# Patient Record
Sex: Female | Born: 1995 | Race: White | Hispanic: No | Marital: Single | Smoking: Never smoker
Health system: Southern US, Community
[De-identification: ages and names within clinical notes are randomized; demographics above are authoritative.]

## PROBLEM LIST (undated history)

## (undated) DIAGNOSIS — F329 Major depressive disorder, single episode, unspecified: Secondary | ICD-10-CM

## (undated) DIAGNOSIS — Z8619 Personal history of other infectious and parasitic diseases: Secondary | ICD-10-CM

## (undated) DIAGNOSIS — F419 Anxiety disorder, unspecified: Secondary | ICD-10-CM

## (undated) DIAGNOSIS — F32A Depression, unspecified: Secondary | ICD-10-CM

## (undated) HISTORY — DX: Anxiety disorder, unspecified: F41.9

## (undated) HISTORY — DX: Depression, unspecified: F32.A

## (undated) HISTORY — DX: Personal history of other infectious and parasitic diseases: Z86.19

---
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
## 2017-02-18 ENCOUNTER — Ambulatory Visit (INDEPENDENT_AMBULATORY_CARE_PROVIDER_SITE_OTHER): Payer: BC Managed Care – PPO | Admitting: Family Medicine

## 2017-02-18 ENCOUNTER — Encounter: Payer: Self-pay | Admitting: Family Medicine

## 2017-02-18 VITALS — BP 122/82 | HR 87 | Temp 98.4°F | Ht 65.0 in | Wt 167.0 lb

## 2017-02-18 DIAGNOSIS — N943 Premenstrual tension syndrome: Secondary | ICD-10-CM | POA: Diagnosis not present

## 2017-02-18 DIAGNOSIS — J039 Acute tonsillitis, unspecified: Secondary | ICD-10-CM | POA: Diagnosis not present

## 2017-02-18 DIAGNOSIS — J3501 Chronic tonsillitis: Secondary | ICD-10-CM | POA: Diagnosis not present

## 2017-02-18 DIAGNOSIS — E663 Overweight: Secondary | ICD-10-CM

## 2017-02-18 LAB — COMPREHENSIVE METABOLIC PANEL
ALT: 13 U/L (ref 0–35)
AST: 18 U/L (ref 0–37)
Albumin: 4.1 g/dL (ref 3.5–5.2)
Alkaline Phosphatase: 44 U/L (ref 39–117)
BUN: 10 mg/dL (ref 6–23)
CO2: 28 mEq/L (ref 19–32)
Calcium: 9.7 mg/dL (ref 8.4–10.5)
Chloride: 104 mEq/L (ref 96–112)
Creatinine, Ser: 0.98 mg/dL (ref 0.40–1.20)
GFR: 76.04 mL/min (ref >60.00)
Glucose, Bld: 108 mg/dL — ABNORMAL HIGH (ref 70–99)
Potassium: 4.2 mEq/L (ref 3.5–5.1)
Sodium: 138 mEq/L (ref 135–145)
Total Bilirubin: 0.5 mg/dL (ref 0.2–1.2)
Total Protein: 7.6 g/dL (ref 6.0–8.3)

## 2017-02-18 LAB — CBC WITH DIFFERENTIAL/PLATELET
Basophils Absolute: 0 10*3/uL (ref 0.0–0.1)
Basophils Relative: 0.6 % (ref 0.0–3.0)
Eosinophils Absolute: 0.1 10*3/uL (ref 0.0–0.7)
Eosinophils Relative: 1.1 % (ref 0.0–5.0)
HCT: 37.5 % (ref 36.0–46.0)
Hemoglobin: 12.7 g/dL (ref 12.0–15.0)
Lymphocytes Relative: 17.1 % (ref 12.0–46.0)
Lymphs Abs: 1.2 10*3/uL (ref 0.7–4.0)
MCHC: 33.7 g/dL (ref 30.0–36.0)
MCV: 91.4 fl (ref 78.0–100.0)
Monocytes Absolute: 0.5 10*3/uL (ref 0.1–1.0)
Monocytes Relative: 7.7 % (ref 3.0–12.0)
Neutro Abs: 5.2 10*3/uL (ref 1.4–7.7)
Neutrophils Relative %: 73.5 % (ref 43.0–77.0)
Platelets: 289 10*3/uL (ref 150.0–400.0)
RBC: 4.11 Mil/uL (ref 3.87–5.11)
RDW: 12.6 % (ref 11.5–15.5)
WBC: 7.1 10*3/uL (ref 4.0–10.5)

## 2017-02-18 LAB — POCT MONO (EPSTEIN BARR VIRUS): Mono, POC: NEGATIVE

## 2017-02-18 NOTE — Progress Notes (Signed)
Jacqueline Giles is a 21 y.o. female is here to [facility].   Patient Care Team: Helane Rima, DO as PCP - General (Family Medicine)   History of Present Illness:  Britt Bottom CMA acting as scribe for Dr. Earlene Plater.  HPI:   1. Tonsillitis, chronic. Bilateral. Worse over the past year. She has been seen multiple times at [facility] and reports that she was told at the last visit that her Mono was +. Endorses snoring. No weight loss, night sweats, fatigue. No other focal, systemic, or lymphatic concerns.   2. Premenstrual syndrome. Endorses "bad PMS" each month. Interested in LARC, specifically Mirena. Sexually active. No Hx of STIs. No Hx of pregnancy.     Health Maintenance Due  Topic Date Due  . HIV Screening  12/29/2010  . TETANUS/TDAP  12/29/2014  . INFLUENZA VACCINE  12/05/2016  . PAP SMEAR  12/28/2016   Depression screen PHQ 2/9 02/21/2017  Decreased Interest 0  Down, Depressed, Hopeless 0  PHQ - 2 Score 0    PMHx, SurgHx, SocialHx, Medications, and Allergies were reviewed in the Visit Navigator and updated as appropriate.   No past medical history on file. No past surgical history on file. No family history on file.   Social History  Substance Use Topics  . Smoking status: Never Smoker  . Smokeless tobacco: Never Used  . Alcohol use Yes     Comment: Social    Current Medications and Allergies:   Current Outpatient Prescriptions:  .  Prenatal Vit-Fe Fumarate-FA (PRENATAL MULTIVITAMIN) TABS tablet, Take 1 tablet by mouth daily at 12 noon., Disp: , Rfl:   No Known Allergies   Review of Systems:   Pertinent items are noted in the HPI. Otherwise, ROS is negative.  Vitals:   Vitals:   02/18/17 1458  BP: 122/82  Pulse: 87  Temp: 98.4 F (36.9 C)  TempSrc: Oral  SpO2: 99%  Weight: 167 lb (75.8 kg)  Height:  (1.651 m)     Body mass index is 27.79 kg/m.   Physical Exam:   Physical Exam  Constitutional: She is oriented to person, place, and time.  She appears well-developed and well-nourished. No distress.  HENT:  Head: Normocephalic and atraumatic.  Right Ear: External ear normal.  Left Ear: External ear normal.  Nose: Nose normal.  Mouth/Throat: Oropharynx is clear and moist. Tonsils are 3+ on the right. No tonsillar exudate.  Eyes: Pupils are equal, round, and reactive to light. Conjunctivae and EOM are normal.  Neck: Normal range of motion. Neck supple. No thyromegaly present.  Cardiovascular: Normal rate, regular rhythm, normal heart sounds and intact distal pulses.   Pulmonary/Chest: Effort normal and breath sounds normal.  Abdominal: Soft. Bowel sounds are normal.  Musculoskeletal: Normal range of motion.  Lymphadenopathy:    She has no cervical adenopathy.  Neurological: She is alert and oriented to person, place, and time.  Skin: Skin is warm and dry. Capillary refill takes less than 2 seconds.  Psychiatric: She has a normal mood and affect. Her behavior is normal.  Nursing note and vitals reviewed.   Results for orders placed or performed in visit on 02/18/17  CBC with Differential/Platelet  Result Value Ref Range   WBC 7.1 4.0 - 10.5 K/uL   RBC 4.11 3.87 - 5.11 Mil/uL   Hemoglobin 12.7 12.0 - 15.0 g/dL   HCT 95.6 38.7 - 56.4 %   MCV 91.4 78.0 - 100.0 fl   MCHC 33.7 30.0 - 36.0 g/dL  RDW 12.6 11.5 - 15.5 %   Platelets 289.0 150.0 - 400.0 K/uL   Neutrophils Relative % 73.5 43.0 - 77.0 %   Lymphocytes Relative 17.1 12.0 - 46.0 %   Monocytes Relative 7.7 3.0 - 12.0 %   Eosinophils Relative 1.1 0.0 - 5.0 %   Basophils Relative 0.6 0.0 - 3.0 %   Neutro Abs 5.2 1.4 - 7.7 K/uL   Lymphs Abs 1.2 0.7 - 4.0 K/uL   Monocytes Absolute 0.5 0.1 - 1.0 K/uL   Eosinophils Absolute 0.1 0.0 - 0.7 K/uL   Basophils Absolute 0.0 0.0 - 0.1 K/uL  Comprehensive metabolic panel  Result Value Ref Range   Sodium 138 135 - 145 mEq/L   Potassium 4.2 3.5 - 5.1 mEq/L   Chloride 104 96 - 112 mEq/L   CO2 28 19 - 32 mEq/L   Glucose, Bld  108 (H) 70 - 99 mg/dL   BUN 10 6 - 23 mg/dL   Creatinine, Ser 0.98 0.40 - 1.20 mg/dL   Total Bilirubin 0.5 0.2 - 1.2 mg/dL   Alkaline Phosphatase 44 39 - 117 U/L   AST 18 0 - 37 U/L   ALT 13 0 - 35 U/L   Total Protein 7.6 6.0 - 8.3 g/dL   Albumin 4.1 3.5 - 5.2 g/dL   Calcium 9.7 8.4 - 11.9 mg/dL   GFR 14.78 >29.56 mL/min  POCT Mono (Epstein Barr Virus)  Result Value Ref Range   Mono, POC Negative Negative    Assessment and Plan:   Bree was seen today for establish care.  Diagnoses and all orders for this visit:  Tonsillitis, chronic Comments: Recent reported Mono +. Negative today. Labs reassuring. Reports of snoring. To ENT. Orders: -     CBC with Differential/Platelet -     Comprehensive metabolic panel -     Cancel: Monospot -     Ambulatory referral to ENT -     POCT Mono (Epstein Barr Virus)  Overweight (BMI 25.0-29.9) Comments: The patient is asked to make an attempt to improve diet and exercise patterns to aid in medical management of this problem.   Premenstrual syndrome Comments: Versus PMDD. Patient is requesting Mirena. Will refer to GYN. Orders: -     Ambulatory referral to Gynecology   . Reviewed expectations re: course of current medical issues. . Discussed self-management of symptoms. . Outlined signs and symptoms indicating need for more acute intervention. . Patient verbalized understanding and all questions were answered. Marland Kitchen Health Maintenance issues including appropriate healthy diet, exercise, and smoking avoidance were discussed with patient. . See orders for this visit as documented in the electronic medical record. . Patient received an After Visit Summary.  CMA served as Neurosurgeon during this visit. History, Physical, and Plan performed by medical provider. The above documentation has been reviewed and is accurate and complete. Helane Rima, D.O.  Helane Rima, DO [place] [place] [facility] 02/21/2017

## 2017-02-20 ENCOUNTER — Telehealth: Payer: Self-pay | Admitting: *Deleted

## 2017-02-20 NOTE — Telephone Encounter (Signed)
Patient called requesting her lab results. Please advise.

## 2017-02-21 ENCOUNTER — Encounter: Payer: Self-pay | Admitting: Family Medicine

## 2017-02-21 NOTE — Telephone Encounter (Signed)
Please advise 

## 2017-02-21 NOTE — Telephone Encounter (Signed)
Labs very reassuring. Mono was actually negative. Okay to send in Prednisone 5 mg (#21). However, patient does not actually need to take.ENT and GYN referrals are in. Apologize to her for me - you can let her know that I had to be out a day.

## 2017-02-22 ENCOUNTER — Telehealth: Payer: Self-pay | Admitting: Obstetrics and Gynecology

## 2017-02-22 NOTE — Telephone Encounter (Signed)
Tried to call patient.  No answer.  Voicemail not set up. 

## 2017-02-22 NOTE — Telephone Encounter (Signed)
Called patient to schedule a new patient appointment with Dr. Oscar LaJertson but her voice mail is not set up, unable to leave a message to call back.

## 2017-03-01 NOTE — Telephone Encounter (Signed)
Spoke with patient and gave results.  She declined prednisone taper.  She has appointment scheduled with GYN.  Still waiting to hear from ENT.

## 2017-04-06 HISTORY — PX: TONSILLECTOMY: SUR1361

## 2017-04-14 ENCOUNTER — Telehealth: Payer: Self-pay | Admitting: Obstetrics and Gynecology

## 2017-04-14 NOTE — Telephone Encounter (Signed)
04/14/17 spoke with pt re: dr cx appt for AEX/consult Mirena due to inclement weather/[place]

## 2017-04-15 ENCOUNTER — Ambulatory Visit: Payer: BC Managed Care – PPO | Admitting: Obstetrics and Gynecology

## 2017-04-23 ENCOUNTER — Emergency Department
Admission: EM | Admit: 2017-04-23 | Discharge: 2017-04-23 | Disposition: A | Discharge status: Home / Self Care | Payer: BC Managed Care – PPO | Attending: Emergency Medicine | Admitting: Emergency Medicine

## 2017-04-23 ENCOUNTER — Encounter: Payer: Self-pay | Admitting: Intensive Care

## 2017-04-23 DIAGNOSIS — R531 Weakness: Secondary | ICD-10-CM | POA: Insufficient documentation

## 2017-04-23 DIAGNOSIS — Z79899 Other long term (current) drug therapy: Secondary | ICD-10-CM | POA: Insufficient documentation

## 2017-04-23 LAB — BASIC METABOLIC PANEL
ANION GAP: 8 (ref 5–15)
BUN: 8 mg/dL (ref 6–20)
CALCIUM: 9.4 mg/dL (ref 8.9–10.3)
CO2: 28 mmol/L (ref 22–32)
Chloride: 100 mmol/L — ABNORMAL LOW (ref 101–111)
Creatinine, Ser: 0.76 mg/dL (ref 0.44–1.00)
Glucose, Bld: 103 mg/dL — ABNORMAL HIGH (ref 65–99)
Potassium: 3.8 mmol/L (ref 3.5–5.1)
SODIUM: 136 mmol/L (ref 135–145)

## 2017-04-23 LAB — CBC WITH DIFFERENTIAL/PLATELET
BASOS ABS: 0 10*3/uL (ref 0–0.1)
BASOS PCT: 0 %
EOS ABS: 0.1 10*3/uL (ref 0–0.7)
EOS PCT: 1 %
HEMATOCRIT: 37.5 % (ref 35.0–47.0)
Hemoglobin: 12.9 g/dL (ref 12.0–16.0)
Lymphocytes Relative: 10 %
Lymphs Abs: 1.1 10*3/uL (ref 1.0–3.6)
MCH: 30.5 pg (ref 26.0–34.0)
MCHC: 34.4 g/dL (ref 32.0–36.0)
MCV: 88.6 fL (ref 80.0–100.0)
MONO ABS: 0.9 10*3/uL (ref 0.2–0.9)
Monocytes Relative: 8 %
NEUTROS ABS: 8.9 10*3/uL — AB (ref 1.4–6.5)
Neutrophils Relative %: 81 %
PLATELETS: 306 10*3/uL (ref 150–440)
RBC: 4.23 MIL/uL (ref 3.80–5.20)
RDW: 12 % (ref 11.5–14.5)
WBC: 11.1 10*3/uL — ABNORMAL HIGH (ref 3.6–11.0)

## 2017-04-23 NOTE — ED Provider Notes (Signed)
[facility] Emergency Department Provider Note  ____________________________________________   First MD Initiated Contact with Patient 04/23/17 1859     (approximate)  I have reviewed the triage vital signs and the nursing notes.   HISTORY  Chief Complaint No chief complaint on file.   HPI Jacqueline JewsOlivia Giles is a 21 y.o. female with a recent tonsillectomy 5 days ago who is presenting to the emergency department with weakness.  She is accompanied by her mother who is concerned that the patient may be losing blood from her tonsillectomy.  The mother reports that the patient's sister had a bleeding post operative complication during a tonsillectomy and never coughed up any blood.  Patient does not report any dark stools or blood in her stools.  Says that she had a temperature of 100.2 last night but no temperature today.  Has been taking oxycodone.  Does not report taking Tylenol or ibuprofen.   History reviewed. No pertinent past medical history.  There are no active problems to display for this patient.   History reviewed. No pertinent surgical history.  Prior to Admission medications   Medication Sig Start Date End Date Taking? Authorizing Provider  Prenatal Vit-Fe Fumarate-FA (PRENATAL MULTIVITAMIN) TABS tablet Take 1 tablet by mouth daily at 12 noon.    [provider]    Allergies Patient has no known allergies.  History reviewed. No pertinent family history.  Social History Social History   Tobacco Use  . Smoking status: Never Smoker  . Smokeless tobacco: Never Used  Substance Use Topics  . Alcohol use: Yes    Comment: Social   . Drug use: No    Review of Systems  Constitutional: No fever/chills Eyes: No visual changes. ENT: No sore throat. Cardiovascular: Denies chest pain. Respiratory: Denies shortness of breath. Gastrointestinal: No abdominal pain.  No nausea, no vomiting.  No diarrhea.  No constipation. Genitourinary:  Negative for dysuria. Musculoskeletal: Negative for back pain. Skin: Negative for rash. Neurological: Negative for headaches, focal weakness or numbness.   ____________________________________________   PHYSICAL EXAM:  VITAL SIGNS: ED Triage Vitals [04/23/17 1830]  Enc Vitals Group     BP (!) 145/76     Pulse Rate 91     Resp 18     Temp 98.5 F (36.9 C)     Temp Source Oral     SpO2 100 %     Weight 160 lb (72.6 kg)     Height 5\' 6"  (1.676 m)     Head Circumference      Peak Flow      Pain Score 3     Pain Loc      Pain Edu?      Excl. in GC?     Constitutional: Alert and oriented. Well appearing and in no acute distress. Eyes: Conjunctivae are normal.  Head: Atraumatic. Nose: No congestion/rhinnorhea. Mouth/Throat: Mucous membranes are moist.  SR visualized the bilateral tonsils without any bleeding or scabbing.  Patient speaking with a normal voice.  Controlling secretions. Neck: No stridor.   Cardiovascular: Normal rate, regular rhythm. Grossly normal heart sounds.   Respiratory: Normal respiratory effort.  No retractions. Lungs CTAB. Gastrointestinal: Soft and nontender. No distention.  Musculoskeletal: No lower extremity tenderness nor edema.   Neurologic:  Normal speech and language. No gross focal neurologic deficits are appreciated. Skin:  Skin is warm, dry and intact. No rash noted. Psychiatric: Mood and affect are normal. Speech and behavior are normal.  ____________________________________________  LABS (all labs ordered are listed, but only abnormal results are displayed)  Labs Reviewed  CBC WITH DIFFERENTIAL/PLATELET - Abnormal; Notable for the following components:      Result Value   WBC 11.1 (*)    Neutro Abs 8.9 (*)    All other components within normal limits  BASIC METABOLIC PANEL - Abnormal; Notable for the following components:   Chloride 100 (*)    Glucose, Bld 103 (*)    All other components within normal limits    ____________________________________________  EKG  ____________________________________________  RADIOLOGY   ____________________________________________   PROCEDURES  Procedure(s) performed:   Procedures  Critical Care performed:   ____________________________________________   INITIAL IMPRESSION / ASSESSMENT AND PLAN / ED COURSE  Pertinent labs & imaging results that were available during my care of the patient were reviewed by me and considered in my medical decision making (see chart for details).  DDX: Anemia, dehydration, weakness, viral syndrome No previous records for review on file   ----------------------------------------- 8:01 PM on 04/23/2017 -----------------------------------------  Patient with normal hemoglobin.  Slightly elevated white blood cell count which may be reactive secondary to the surgery recently.  Reviewed the lab results with patient as well as her mother who is at the bedside.  The mother called the ENT earlier this afternoon and says that she will be calling in the morning for follow-up.  They will continue their current plan at home for plenty of fluids.  They are also aware that oxycodone can be sedating and may be contributing to the patient's weakness.  ____________________________________________   FINAL CLINICAL IMPRESSION(S) / ED DIAGNOSES  Weakness.  Postop check for tonsillectomy    NEW MEDICATIONS STARTED DURING THIS VISIT:  This SmartLink is deprecated. Use AVSMEDLIST instead to display the medication list for a patient.   Note:  This document was prepared using Dragon voice recognition software and may include unintentional dictation errors.     Myrna BlazerSchaevitz, David Matthew, MD 04/23/17 2002

## 2017-04-23 NOTE — ED Notes (Signed)
Pt stating that she had her tonsils removed on Friday this past week. Pt's mother stating that pt was doing really well until today. Pt had a fever last night. Pt stating that today she began feeling "really weak." Pt stating that she also has had nausea. Pt stating that she is still tasting some blood but it hasn't increased. Pt stating that she has had no energy today. Pt stating last BM was Thursday before surgery. Pt in NAD at this time. Airway is intact.

## 2017-04-23 NOTE — ED Notes (Signed)
Dr Pershing ProudSchaevitz is at pt's bedside.

## 2017-04-23 NOTE — ED Triage Notes (Signed)
Tonsillectomy X5 days ago. Pt c/o tasting blood in the back of her throat and feels weak. Pt states "I struggle to stand up and feel like I have been sleeping a lot" Pt is taking oxycodone for pain. Ambulatory back to triage with no problems and A&O x4.

## 2017-05-06 ENCOUNTER — Telehealth: Payer: Self-pay | Admitting: Obstetrics and Gynecology

## 2017-05-06 NOTE — Telephone Encounter (Signed)
Called patient to confirm new patient appointment for Wednesday.  Patient cancelled says she is out of town for the week.

## 2017-05-08 ENCOUNTER — Ambulatory Visit: Payer: BC Managed Care – PPO | Admitting: Obstetrics and Gynecology

## 2017-07-29 ENCOUNTER — Ambulatory Visit: Payer: BC Managed Care – PPO | Admitting: Obstetrics and Gynecology

## 2017-07-29 ENCOUNTER — Encounter: Payer: Self-pay | Admitting: Obstetrics and Gynecology

## 2017-07-29 ENCOUNTER — Other Ambulatory Visit (HOSPITAL_COMMUNITY)
Admission: RE | Admit: 2017-07-29 | Discharge: 2017-07-29 | Disposition: A | Discharge status: Home / Self Care | Payer: BC Managed Care – PPO | Source: Ambulatory Visit | Attending: Obstetrics and Gynecology | Admitting: Obstetrics and Gynecology

## 2017-07-29 ENCOUNTER — Other Ambulatory Visit: Payer: Self-pay

## 2017-07-29 VITALS — BP 130/80 | HR 84 | Resp 14 | Ht 65.25 in | Wt 163.0 lb

## 2017-07-29 DIAGNOSIS — N898 Other specified noninflammatory disorders of vagina: Secondary | ICD-10-CM

## 2017-07-29 DIAGNOSIS — Z113 Encounter for screening for infections with a predominantly sexual mode of transmission: Secondary | ICD-10-CM | POA: Diagnosis not present

## 2017-07-29 DIAGNOSIS — Z124 Encounter for screening for malignant neoplasm of cervix: Secondary | ICD-10-CM | POA: Diagnosis not present

## 2017-07-29 DIAGNOSIS — N943 Premenstrual tension syndrome: Secondary | ICD-10-CM | POA: Diagnosis not present

## 2017-07-29 DIAGNOSIS — Z01419 Encounter for gynecological examination (general) (routine) without abnormal findings: Secondary | ICD-10-CM | POA: Diagnosis not present

## 2017-07-29 DIAGNOSIS — Z3009 Encounter for other general counseling and advice on contraception: Secondary | ICD-10-CM

## 2017-07-29 NOTE — Patient Instructions (Addendum)
EXERCISE AND DIET:  We recommended that you start or continue a regular exercise program for good health. Regular exercise means any activity that makes your heart beat faster and makes you sweat.  We recommend exercising at least 30 minutes per day at least 3 days a week, preferably 4 or 5.  We also recommend a diet low in fat and sugar.  Inactivity, poor dietary choices and obesity can cause diabetes, heart attack, stroke, and kidney damage, among others.    ALCOHOL AND SMOKING:  Women should limit their alcohol intake to no more than 7 drinks/beers/glasses of wine (combined, not each!) per week. Moderation of alcohol intake to this level decreases your risk of breast cancer and liver damage. And of course, no recreational drugs are part of a healthy lifestyle.  And absolutely no smoking or even second hand smoke. Most people know smoking can cause heart and lung diseases, but did you know it also contributes to weakening of your bones? Aging of your skin?  Yellowing of your teeth and nails?  CALCIUM AND VITAMIN D:  Adequate intake of calcium and Vitamin D are recommended.  The recommendations for exact amounts of these supplements seem to change often, but generally speaking 600 mg of calcium (either carbonate or citrate) and 800 units of Vitamin D per day seems prudent. Certain women may benefit from higher intake of Vitamin D.  If you are among these women, your doctor will have told you during your visit.    PAP SMEARS:  Pap smears, to check for cervical cancer or precancers,  have traditionally been done yearly, although recent scientific advances have shown that most women can have pap smears less often.  However, every woman still should have a physical exam from her gynecologist every year. It will include a breast check, inspection of the vulva and vagina to check for abnormal growths or skin changes, a visual exam of the cervix, and then an exam to evaluate the size and shape of the uterus and  ovaries.  And after 22 years of age, a rectal exam is indicated to check for rectal cancers. We will also provide age appropriate advice regarding health maintenance, like when you should have certain vaccines, screening for sexually transmitted diseases, bone density testing, colonoscopy, mammograms, etc.   MAMMOGRAMS:  All women over 40 years old should have a yearly mammogram. Many facilities now offer a "3D" mammogram, which may cost around $50 extra out of pocket. If possible,  we recommend you accept the option to have the 3D mammogram performed.  It both reduces the number of women who will be called back for extra views which then turn out to be normal, and it is better than the routine mammogram at detecting truly abnormal areas.    COLONOSCOPY:  Colonoscopy to screen for colon cancer is recommended for all women at age 50.  We know, you hate the idea of the prep.  We agree, BUT, having colon cancer and not knowing it is worse!!  Colon cancer so often starts as a polyp that can be seen and removed at colonscopy, which can quite literally save your life!  And if your first colonoscopy is normal and you have no family history of colon cancer, most women don't have to have it again for 10 years.  Once every ten years, you can do something that may end up saving your life, right?  We will be happy to help you get it scheduled when you are ready.    Be sure to check your insurance coverage so you understand how much it will cost.  It may be covered as a preventative service at no cost, but you should check your particular policy.       Breast Self-Awareness Breast self-awareness means being familiar with how your breasts look and feel. It involves checking your breasts regularly and reporting any changes to your health care provider. Practicing breast self-awareness is important. A change in your breasts can be a sign of a serious medical problem. Being familiar with how your breasts look and feel  allows you to find any problems early, when treatment is more likely to be successful. All women should practice breast self-awareness, including women who have had breast implants. How to do a breast self-exam One way to learn what is normal for your breasts and whether your breasts are changing is to do a breast self-exam. To do a breast self-exam: Look for Changes  1. Remove all the clothing above your waist. 2. Stand in front of a mirror in a room with good lighting. 3. Put your hands on your hips. 4. Push your hands firmly downward. 5. Compare your breasts in the mirror. Look for differences between them (asymmetry), such as: ? Differences in shape. ? Differences in size. ? Puckers, dips, and bumps in one breast and not the other. 6. Look at each breast for changes in your skin, such as: ? Redness. ? Scaly areas. 7. Look for changes in your nipples, such as: ? Discharge. ? Bleeding. ? Dimpling. ? Redness. ? A change in position. Feel for Changes  Carefully feel your breasts for lumps and changes. It is best to do this while lying on your back on the floor and again while sitting or standing in the shower or tub with soapy water on your skin. Feel each breast in the following way:  Place the arm on the side of the breast you are examining above your head.  Feel your breast with the other hand.  Start in the nipple area and make  inch (2 cm) overlapping circles to feel your breast. Use the pads of your three middle fingers to do this. Apply light pressure, then medium pressure, then firm pressure. The light pressure will allow you to feel the tissue closest to the skin. The medium pressure will allow you to feel the tissue that is a little deeper. The firm pressure will allow you to feel the tissue close to the ribs.  Continue the overlapping circles, moving downward over the breast until you feel your ribs below your breast.  Move one finger-width toward the center of the body.  Continue to use the  inch (2 cm) overlapping circles to feel your breast as you move slowly up toward your collarbone.  Continue the up and down exam using all three pressures until you reach your armpit.  Write Down What You Find  Write down what is normal for each breast and any changes that you find. Keep a written record with breast changes or normal findings for each breast. By writing this information down, you do not need to depend only on memory for size, tenderness, or location. Write down where you are in your menstrual cycle, if you are still menstruating. If you are having trouble noticing differences in your breasts, do not get discouraged. With time you will become more familiar with the variations in your breasts and more comfortable with the exam. How often should I examine my breasts?  Examine your breasts every month. If you are breastfeeding, the best time to examine your breasts is after a feeding or after using a breast pump. If you menstruate, the best time to examine your breasts is 5-7 days after your period is over. During your period, your breasts are lumpier, and it may be more difficult to notice changes. When should I see my health care provider? See your health care provider if you notice:  A change in shape or size of your breasts or nipples.  A change in the skin of your breast or nipples, such as a reddened or scaly area.  Unusual discharge from your nipples.  A lump or thick area that was not there before.  Pain in your breasts.  Anything that concerns you.  This information is not intended to replace advice given to you by your health care provider. Make sure you discuss any questions you have with your health care provider. Document Released: 04/23/2005 Document Revised: 09/29/2015 Document Reviewed: 03/13/2015 Elsevier Interactive Patient Education  2018 [employer]  Premenstrual Syndrome Premenstrual syndrome (PMS) is a group of physical,  emotional, and behavioral symptoms that affect women of childbearing age. PMS starts 1-2 weeks before the start of a woman's period and goes away a few days after the period starts. It often recurs in a predictable pattern. PMS can range from mild to severe. When it is severe, it is called premenstrual dysphoric disorder (PMDD). PMS can interfere in many ways with normal daily activities. What are the causes? The cause of this condition is not known, but it seems to be related to hormone changes that happen before menstruation. What are the signs or symptoms? Symptoms of this condition often happen every month. They go away completely after your period starts. Physical symptoms include:  Bloating.  Breast pain.  Headaches.  Extreme fatigue.  Backaches.  Swelling of the hands and feet.  Weight gain.  Hot flashes.  Emotional and behavioral symptoms include:  Mood swings.  Depression.  Angry outbursts.  Irritability.  Anxiety.  Crying spells.  Food cravings or appetite changes.  Changes in sexual desire.  Confusion.  Aggression.  Social withdrawal.  Poor concentration.  How is this diagnosed? This condition is diagnosed if symptoms of PMS:  Are present in the 5 days before your period starts.  End within 4 days after your period starts.  Happen at least 3 months in a row.  Interfere with some of your normal activities.  Other conditions that can cause some of these symptoms must be ruled out before PMS can be diagnosed. How is this treated? This condition may be treated by:  Maintaining a healthy lifestyle. This includes eating a balanced diet and exercising regularly.  Taking medicines. Medicines can help relieve symptoms such as cramps, aches, pains, headaches, and breast tenderness. Depending on the severity of the condition, your health care provider may recommend: ? Over-the-counter pain medicines. ? Prescription medicines for PMDD.  Follow  these instructions at home: Eating and drinking   Eat a well-balanced diet.  Avoid caffeine and alcohol.  Limit the amount of salt and salty foods you eat. This will help lessen bloating.  Drink enough fluid to keep your urine clear or pale yellow.  Take a multivitamin if told to by your health care provider. Lifestyle  Do not use any tobacco products, such as cigarettes, chewing tobacco, and e-cigarettes. If you need help quitting, ask your health care provider.  Exercise regularly as suggested by your  health care provider.  Get enough sleep.  Practice relaxation techniques.  Limit stress. Other Instructions  For 2-3 months, write down your symptoms, their severity, and how long they last. This will help your health care provider choose the best treatment for you.  Take over-the-counter and prescription medicines only as told by your health care provider.  If you are using oral contraceptive pills, use them as told by your health care provider. This information is not intended to replace advice given to you by your health care provider. Make sure you discuss any questions you have with your health care provider. Document Released: 04/20/2000 Document Revised: 05/25/2015 Document Reviewed: 01/21/2015 Elsevier Interactive Patient Education  [employer].

## 2017-07-29 NOTE — Progress Notes (Signed)
22 y.o. G0P0000 SingleCaucasianF here for annual exam and to discuss birth control options   Period Cycle (Days): 28 Period Duration (Days): 5 days Period Pattern: Regular Menstrual Flow: Moderate Menstrual Control: Tampon Menstrual Control Change Freq (Hours): changes tampon every 2 hours  Dysmenorrhea: (!) Moderate Dysmenorrhea Symptoms: Cramping  Cramps are tolerable with OTC medication. She c/o bad mood changes for a week prior to her cycle for the last couple of months. Goes from angry to sad. Not debilitating. No baseline depression.  Sexually active, same partner for a year, mostly using condoms. She had a tonsillectomy in 12/19. Has had 2 yeast infections since then, both self treated. Now with a 8 week h/o an increase in creamy, white vaginal d/c with an odor.   Patient's last menstrual period was 07/15/2017.          Sexually active: Yes.    The current method of family planning is none and condoms all the time.    Exercising: Yes.    cardio/ lifting Smoker:  no  Health Maintenance: Pap:  N/A TDaP:  12/2016 Gardasil: completed all 3 (she thinks, will check her records).   reports that she has never smoked. She has never used smokeless tobacco. She reports that she drinks alcohol. She reports that she does not use drugs.  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 Would like to go to [place]  Past Medical History:  Diagnosis Date  . Anxiety     Past Surgical History:  Procedure Laterality Date  . TONSILLECTOMY  04/2017    Current Outpatient Medications  Medication Sig Dispense Refill  . Prenatal Vit-Fe Fumarate-FA (PRENATAL MULTIVITAMIN) TABS tablet Take 1 tablet by mouth daily at 12 noon.     No current facility-administered medications for this visit.     Family History  Problem Relation Age of Onset  . Deep vein thrombosis Father [id number]    Her Dad is on blood thinners, he has a h/o a blood clot. Unaware of  a thrombophilia.  Review of Systems  Constitutional: Negative.   HENT: Negative.   Eyes: Negative.   Respiratory: Negative.   Cardiovascular: Negative.   Gastrointestinal: Negative.   Endocrine: Negative.   Genitourinary: Negative.   Musculoskeletal: Negative.   Skin: Negative.   Allergic/Immunologic: Negative.   Neurological: Negative.   Psychiatric/Behavioral: Negative.     Exam:   BP 130/80 (BP Location: Right Arm, Patient Position: Sitting, Cuff Size: Normal)   Pulse 84   Resp 14   Ht 5' 5.25" (1.657 m)   Wt 163 lb (73.9 kg)   LMP 07/15/2017   BMI 26.92 kg/m   Weight change: @WEIGHTCHANGE @ Height:   Height: 5' 5.25" (165.7 cm)  Ht Readings from Last 3 Encounters:  07/29/17 5' 5.25" (1.657 m)  04/23/17 5\' 6"  (1.676 m)  02/18/17 5\' 5"  (1.651 m)    General appearance: alert, cooperative and appears stated age Head: Normocephalic, without obvious abnormality, atraumatic Neck: no adenopathy, supple, symmetrical, trachea midline and thyroid normal to inspection and palpation Lungs: clear to auscultation bilaterally Cardiovascular: regular rate and rhythm Breasts: normal appearance, no masses or tenderness Abdomen: soft, non-tender; non distended,  no masses,  no organomegaly Extremities: extremities normal, atraumatic, no cyanosis or edema Skin: Skin color, texture, turgor normal. No rashes or lesions Lymph nodes: Cervical, supraclavicular, and axillary nodes normal. No abnormal inguinal nodes palpated Neurologic: Grossly normal   Pelvic: External genitalia:  no lesions              Urethra:  normal appearing urethra with no masses, tenderness or lesions              Bartholins and Skenes: normal                 Vagina: normal appearing vagina with an increase in creamy/watery, white vaginal d/c              Cervix: no cervical motion tenderness and no lesions               Bimanual Exam:  Uterus:  normal size, contour, position, consistency, mobility, non-tender  and anteverted              Adnexa: no mass, fullness, tenderness               Rectovaginal: Confirms               Anus:  normal sphincter tone, no lesions  Chaperone was present for exam.  A:  Well Woman with normal exam  Contraception  Family history of DVT  Vaginal d/c  PMS    P:   Pap with GC/CT  STD testing  Affirm sent  Discussed breast self awareness   Recommended she discuss her Father's h/o DVT and check if he was evaluated for a clotting disorder  Discussed option of OCP's or SSRI's for her PMS, she declines  Discussed the risks of OCP's (would want to know more about her Father's history if she wanted to go on OCP's in the future)  Discussed other options of contraception, she is interested in a Mirena IUD, will schedule during her cycle

## 2017-07-30 ENCOUNTER — Other Ambulatory Visit: Payer: Self-pay | Admitting: *Deleted

## 2017-07-30 LAB — HEP, RPR, HIV PANEL
HEP B S AG: NEGATIVE
HIV SCREEN 4TH GENERATION: NONREACTIVE
RPR: NONREACTIVE

## 2017-07-30 LAB — CYTOLOGY - PAP
Chlamydia: NEGATIVE
Diagnosis: NEGATIVE
Neisseria Gonorrhea: NEGATIVE

## 2017-07-30 LAB — HEPATITIS C ANTIBODY: Hep C Virus Ab: <0.1 s/co ratio (ref 0.0–0.9)

## 2017-07-30 LAB — VAGINITIS/VAGINOSIS, DNA PROBE
CANDIDA SPECIES: NEGATIVE
GARDNERELLA VAGINALIS: POSITIVE — AB
TRICHOMONAS VAG: NEGATIVE

## 2017-07-30 MED ORDER — METRONIDAZOLE 500 MG PO TABS: 500.0000 mg | ORAL_TABLET | Freq: Two times a day (BID) | ORAL | 0 refills | Status: DC

## 2017-08-20 ENCOUNTER — Telehealth: Payer: Self-pay | Admitting: Obstetrics and Gynecology

## 2017-08-20 NOTE — Telephone Encounter (Signed)
Patient is waiting for cycle to start for mirena insertion. Says her cycle is late, but she did take a pregnancy test and would like to know if she can bring negative test and have mirena insertion done.

## 2017-08-21 NOTE — Telephone Encounter (Signed)
Spoke with patient. LMP 07/15/17. SA, condoms for contraceptive. Negative UPT 4/16. Denies any other GYN symptoms or pain. Has not started menses, requesting to schedule IUD insertion. States menses have been regular in the past, may be due to increased stress.   Advised patient to return call to office with start of menses to schedule IUD insertion. Advised if menses does not start or any new symptoms develop, return call to office. Advised Dr. Oscar LaJertson will review, our office will return call with any additional recommendations. Patient verbalizes understanding.   Routing to provider for final review. Patient is agreeable to disposition. Will close encounter.

## 2017-09-02 ENCOUNTER — Telehealth: Payer: Self-pay | Admitting: Obstetrics and Gynecology

## 2017-09-02 NOTE — Telephone Encounter (Signed)
Patient called to let the nurse know she started her menstrual cycle on 08/30/17 and she is ready to schedule Mirena insertion.  Cc: Suzy/Rosa

## 2017-09-02 NOTE — Telephone Encounter (Signed)
Spoke with patient. LMP 08/30/17. Mirena IUD insertion scheduled for 09/03/17 at 3:30pm with Dr. Oscar La. Advised to take Motrin 800 mg with food and water one hour before procedure. Patient verbalizes understanding.  Order previously placed.  Routing to provider for final review. Patient is agreeable to disposition. Will close encounter.  Cc: Harland Dingwall

## 2017-09-03 ENCOUNTER — Encounter: Payer: Self-pay | Admitting: Obstetrics and Gynecology

## 2017-09-03 ENCOUNTER — Ambulatory Visit: Payer: BC Managed Care – PPO | Admitting: Obstetrics and Gynecology

## 2017-09-03 ENCOUNTER — Other Ambulatory Visit: Payer: Self-pay

## 2017-09-03 VITALS — BP 120/80 | HR 84 | Resp 14 | Wt 165.0 lb

## 2017-09-03 DIAGNOSIS — Z3009 Encounter for other general counseling and advice on contraception: Secondary | ICD-10-CM

## 2017-09-03 DIAGNOSIS — Z01812 Encounter for preprocedural laboratory examination: Secondary | ICD-10-CM | POA: Diagnosis not present

## 2017-09-03 DIAGNOSIS — Z30430 Encounter for insertion of intrauterine contraceptive device: Secondary | ICD-10-CM | POA: Diagnosis not present

## 2017-09-03 HISTORY — PX: INTRAUTERINE DEVICE (IUD) INSERTION: SHX5877

## 2017-09-03 LAB — POCT URINE PREGNANCY: PREG TEST UR: NEGATIVE

## 2017-09-03 NOTE — Progress Notes (Signed)
GYNECOLOGY  VISIT   HPI: 22 y.o.   Single  Caucasian  female   G0P0000 with Patient's last menstrual period was 08/30/2017.   here for Mirena IUD insertion    Negative cervical cultures on 07/29/17 She does have PMS the week prior to her cycle. Doesn't want to be on birth control pills, worried she will forget them.   GYNECOLOGIC HISTORY: Patient's last menstrual period was 08/30/2017. Contraception:none  Menopausal hormone therapy: none         OB History    Gravida  0   Para  0   Term  0   Preterm  0   AB  0   Living  0     SAB  0   TAB  0   Ectopic  0   Multiple  0   Live Births  0              There are no active problems to display for this patient.   Past Medical History:  Diagnosis Date  . Anxiety     Past Surgical History:  Procedure Laterality Date  . TONSILLECTOMY  04/2017    Current Outpatient Medications  Medication Sig Dispense Refill  . Prenatal Vit-Fe Fumarate-FA (PRENATAL MULTIVITAMIN) TABS tablet Take 1 tablet by mouth daily at 12 noon.     No current facility-administered medications for this visit.      ALLERGIES: Patient has no known allergies.  Family History  Problem Relation Age of Onset  . Deep vein thrombosis Father 60    Social History   Socioeconomic History  . Marital status: Single    Spouse name: Not on file  . Number of children: Not on file  . Years of education: Not on file  . Highest education level: Not on file  Occupational History  . Not on file  Social Needs  . Financial resource strain: Not on file  . Food insecurity:    Worry: Not on file    Inability: Not on file  . Transportation needs:    Medical: Not on file    Non-medical: Not on file  Tobacco Use  . Smoking status: Never Smoker  . Smokeless tobacco: Never Used  Substance and Sexual Activity  . Alcohol use: Yes    Comment: Social   . Drug use: No  . Sexual activity: Yes    Partners: Male    Birth control/protection: None   Lifestyle  . Physical activity:    Days per week: Not on file    Minutes per session: Not on file  . Stress: Not on file  Relationships  . Social connections:    Talks on phone: Not on file    Gets together: Not on file    Attends religious service: Not on file    Active member of club or organization: Not on file    Attends meetings of clubs or organizations: Not on file    Relationship status: Not on file  . Intimate partner violence:    Fear of current or ex partner: Not on file    Emotionally abused: Not on file    Physically abused: Not on file    Forced sexual activity: Not on file  Other Topics Concern  . Not on file  Social History Narrative  . Not on file    Review of Systems  Constitutional: Negative.   HENT: Negative.   Eyes: Negative.   Respiratory: Negative.   Cardiovascular: Negative.  Gastrointestinal: Negative.   Genitourinary: Negative.   Musculoskeletal: Negative.   Skin: Negative.   Neurological: Negative.   Endo/Heme/Allergies: Negative.   Psychiatric/Behavioral: Negative.     PHYSICAL EXAMINATION:    BP 120/80 (BP Location: Right Arm, Patient Position: Sitting, Cuff Size: Normal)   Pulse 84   Resp 14   Wt 165 lb (74.8 kg)   LMP 08/30/2017   BMI 27.25 kg/m     General appearance: alert, cooperative and appears stated age  Pelvic: External genitalia:  no lesions              Urethra:  normal appearing urethra with no masses, tenderness or lesions              Bartholins and Skenes: normal                 Vagina: normal appearing vagina with normal color and discharge, no lesions              Cervix: no lesions  The risks of the mirena IUD were reviewed with the patient, including infection, abnormal bleeding and uterine perfortion. Consent was signed.  A speculum was placed in the vagina, the cervix was cleansed with betadine. A tenaculum was placed on the cervix, the uterus sounded to 7 cm. The cervix was dilated to a 5 hagar  dilator  The mirena IUD was inserted without difficulty. The string were cut to 3-4 cm. The tenaculum was removed. Slight oozing from the tenaculum site was stopped with pressure.   The patient tolerated the procedure well.   Chaperone was present for exam.  ASSESSMENT Contraception PMS    PLAN Mirena IUD inserted, f/u in one month Discussed option of OCP's or SSRI's for PMS. Discussed the option of luteal phase or continued SSRI use (if she isn't having cycles it would be difficult to use luteal phase treatment) Will further discuss at her next visit   An After Visit Summary was printed and given to the patient.

## 2017-09-03 NOTE — Patient Instructions (Signed)
IUD Post-procedure Instructions . Cramping is common.  You may take Ibuprofen, Aleve, or Tylenol for the cramping.  This should resolve within 24 hours.   . You may have a small amount of spotting.  You should wear a mini pad for the next few days. . You may have intercourse in 24 hours. . You need to call the office if you have any pelvic pain, fever, heavy bleeding, or foul smelling vaginal discharge. . Shower or bathe as normal  Premenstrual Syndrome Premenstrual syndrome (PMS) is a group of physical, emotional, and behavioral symptoms that affect women of childbearing age. PMS starts 1-2 weeks before the start of a woman's period and goes away a few days after the period starts. It often recurs in a predictable pattern. PMS can range from mild to severe. When it is severe, it is called premenstrual dysphoric disorder (PMDD). PMS can interfere in many ways with normal daily activities. What are the causes? The cause of this condition is not known, but it seems to be related to hormone changes that happen before menstruation. What are the signs or symptoms? Symptoms of this condition often happen every month. They go away completely after your period starts. Physical symptoms include:  Bloating.  Breast pain.  Headaches.  Extreme fatigue.  Backaches.  Swelling of the hands and feet.  Weight gain.  Hot flashes.  Emotional and behavioral symptoms include:  Mood swings.  Depression.  Angry outbursts.  Irritability.  Anxiety.  Crying spells.  Food cravings or appetite changes.  Changes in sexual desire.  Confusion.  Aggression.  Social withdrawal.  Poor concentration.  How is this diagnosed? This condition is diagnosed if symptoms of PMS:  Are present in the 5 days before your period starts.  End within 4 days after your period starts.  Happen at least 3 months in a row.  Interfere with some of your normal activities.  Other conditions that can cause  some of these symptoms must be ruled out before PMS can be diagnosed. How is this treated? This condition may be treated by:  Maintaining a healthy lifestyle. This includes eating a balanced diet and exercising regularly.  Taking medicines. Medicines can help relieve symptoms such as cramps, aches, pains, headaches, and breast tenderness. Depending on the severity of the condition, your health care provider may recommend: ? Over-the-counter pain medicines. ? Prescription medicines for PMDD.  Follow these instructions at home: Eating and drinking   Eat a well-balanced diet.  Avoid caffeine and alcohol.  Limit the amount of salt and salty foods you eat. This will help lessen bloating.  Drink enough fluid to keep your urine clear or pale yellow.  Take a multivitamin if told to by your health care provider. Lifestyle  Do not use any tobacco products, such as cigarettes, chewing tobacco, and e-cigarettes. If you need help quitting, ask your health care provider.  Exercise regularly as suggested by your health care provider.  Get enough sleep.  Practice relaxation techniques.  Limit stress. Other Instructions  For 2-3 months, write down your symptoms, their severity, and how long they last. This will help your health care provider choose the best treatment for you.  Take over-the-counter and prescription medicines only as told by your health care provider.  If you are using oral contraceptive pills, use them as told by your health care provider. This information is not intended to replace advice given to you by your health care provider. Make sure you discuss any questions you  have with your health care provider. Document Released: 04/20/2000 Document Revised: 05/25/2015 Document Reviewed: 01/21/2015 Elsevier Interactive Patient Education  [employer].

## 2017-10-14 ENCOUNTER — Other Ambulatory Visit: Payer: Self-pay

## 2017-10-14 ENCOUNTER — Ambulatory Visit: Payer: BC Managed Care – PPO | Admitting: Obstetrics and Gynecology

## 2017-10-14 ENCOUNTER — Encounter: Payer: Self-pay | Admitting: Obstetrics and Gynecology

## 2017-10-14 VITALS — BP 118/78 | HR 80 | Resp 14 | Wt 168.0 lb

## 2017-10-14 DIAGNOSIS — Z30431 Encounter for routine checking of intrauterine contraceptive device: Secondary | ICD-10-CM | POA: Diagnosis not present

## 2017-10-14 DIAGNOSIS — N644 Mastodynia: Secondary | ICD-10-CM

## 2017-10-14 LAB — POCT URINE PREGNANCY: Preg Test, Ur: NEGATIVE

## 2017-10-14 NOTE — Patient Instructions (Signed)
To try and decrease your breast pain, you should have a well fitting supportive bra, cut back on caffeine, and use ice or heat as needed. Some women find relief with the supplement evening primrose oil.  

## 2017-10-14 NOTE — Progress Notes (Signed)
GYNECOLOGY  VISIT   HPI: 22 y.o.   Single  Caucasian  female   [id number] with No LMP recorded. (Menstrual status: IUD).   here for IUD check. She had bad cramps for the first 3 weeks. Cramps are improving, not gone. Slight spotting daily. Had heavier spotting for about 3 days when her cycle was due.  No dyspareunia.  PMS has been tolerable since IUD insertion.  She c/o bilateral breast pain, started around the time of IUD insertion. She drinks 1-2 cups of coffee a day.      GYNECOLOGIC HISTORY: No LMP recorded. (Menstrual status: IUD). Contraception:IUD (Mirena)  Menopausal hormone therapy: none         OB History    Gravida  0   Para  0   Term  0   Preterm  0   AB  0   Living  0     SAB  0   TAB  0   Ectopic  0   Multiple  0   Live Births  0              There are no active problems to display for this patient.   Past Medical History:  Diagnosis Date  . Anxiety     Past Surgical History:  Procedure Laterality Date  . INTRAUTERINE DEVICE (IUD) INSERTION  09/03/2017   Mirena   . TONSILLECTOMY  04/2017    Current Outpatient Medications  Medication Sig Dispense Refill  . Biotin w/ Vitamins C & E (HAIR/SKIN/NAILS PO) Take by mouth.    . levonorgestrel (MIRENA) 20 MCG/24HR IUD 1 each by Intrauterine route once.    . Probiotic Product (PROBIOTIC PO) Take by mouth.     No current facility-administered medications for this visit.      ALLERGIES: Patient has no known allergies.  Family History  Problem Relation Age of Onset  . Deep vein thrombosis Father 5145    Social History   Socioeconomic History  . Marital status: Single    Spouse name: Not on file  . Number of children: Not on file  . Years of education: Not on file  . Highest education level: Not on file  Occupational History  . Not on file  Social Needs  . Financial resource strain: Not on file  . Food insecurity:    Worry: Not on file    Inability: Not on file  . Transportation  needs:    Medical: Not on file    Non-medical: Not on file  Tobacco Use  . Smoking status: Never Smoker  . Smokeless tobacco: Never Used  Substance and Sexual Activity  . Alcohol use: Yes    Comment: Social   . Drug use: No  . Sexual activity: Yes    Partners: Male    Birth control/protection: IUD    Comment: Mirena 09-03-17   Lifestyle  . Physical activity:    Days per week: Not on file    Minutes per session: Not on file  . Stress: Not on file  Relationships  . Social connections:    Talks on phone: Not on file    Gets together: Not on file    Attends religious service: Not on file    Active member of club or organization: Not on file    Attends meetings of clubs or organizations: Not on file    Relationship status: Not on file  . Intimate partner violence:    Fear of current or ex partner:  Not on file    Emotionally abused: Not on file    Physically abused: Not on file    Forced sexual activity: Not on file  Other Topics Concern  . Not on file  Social History Narrative  . Not on file    Review of Systems  Constitutional: Negative.   HENT: Negative.   Eyes: Negative.   Respiratory: Negative.   Cardiovascular: Negative.   Gastrointestinal: Positive for constipation.       Bloating  Genitourinary:       Breast pain   Musculoskeletal: Negative.   Skin: Negative.   Neurological: Negative.   Endo/Heme/Allergies: Negative.   Psychiatric/Behavioral: Negative.     PHYSICAL EXAMINATION:    BP 118/78 (BP Location: Right Arm, Patient Position: Sitting, Cuff Size: Normal)   Pulse 80   Resp 14   Wt 168 lb (76.2 kg)   BMI 27.74 kg/m     General appearance: alert, cooperative and appears stated age Breasts: normal appearance, no masses, mild tenderness bilaterally  Pelvic: External genitalia:  no lesions              Urethra:  normal appearing urethra with no masses, tenderness or lesions              Bartholins and Skenes: normal                 Vagina: normal  appearing vagina with normal color and discharge, no lesions              Cervix: no cervical motion tenderness, no lesions and IUD string 3 cm              Bimanual Exam:  Uterus:  normal size, contour, position, consistency, mobility, non-tender and anteverted              Adnexa: no mass, fullness, tenderness               Chaperone was present for exam.  ASSESSMENT IUD check, had bad cramps for 3 weeks, improving. Normal exam Bilateral mastalgia    PLAN UPT If cramping doesn't continue to improve, she should return for a gyn ultrasound Discussed decreasing caffeine intake, using heat or ice on her breasts, ibuprofen prn, make sure her bra's are fitting appropriately. Discussed the option of evening primrose oil   An After Visit Summary was printed and given to the patient.  ~15 minutes face to face time of which over 50% was spent in counseling.

## 2018-01-23 NOTE — Progress Notes (Signed)
Jacqueline Giles is a 22 y.o. female here for a new problem.  I acted as a Neurosurgeonscribe for Energy East CorporationSamantha Koichi Platte, PA-C Corky Mullonna Orphanos, LPN  History of Present Illness:   Chief Complaint  Patient presents with  . Depression    Depression         This is a new problem.  Episode onset: Started about 3 1/2 months ago, beginning of Summer.   The onset quality is gradual.   The problem occurs daily.  The problem has been gradually worsening since onset.  Associated symptoms include decreased concentration, fatigue, helplessness, hopelessness, insomnia (4-5 hours a night), irritable, restlessness, decreased interest, headaches (everyday) and sad.  Associated symptoms include no appetite change, no body aches, no indigestion and no suicidal ideas.     The symptoms are aggravated by family issues, work stress and social issues [employer](School).  Past treatments include nothing.  Does have past medical history of anxiety. Grades are starting to suffer due to her mood - she is having difficulty concentrating on classes and tasks. She states that over the summer she had a difficult schedule -- she took two really difficult classes, worked full time and stayed with her dad. Her parents are divorced and spends school year with mom, summer with dad. Mom is good support system. She got engaged on Sunday, f[place] is in [place] in [employer] [place]. She currently works 3 x 12 hour shifts and is taking 5 classes.  She is present with her mom today.   Wt Readings from Last 5 Encounters:  01/24/18 168 lb (76.2 kg)  10/14/17 168 lb (76.2 kg)  09/03/17 165 lb (74.8 kg)  07/29/17 163 lb (73.9 kg)  04/23/17 160 lb (72.6 kg)   Dep[facility] 2/9 01/24/2018 02/21/2017  Decreased Interest 3 0  Down, Depressed, Hopeless 3 0  PHQ - 2 Score 6 0  Altered sleeping 3 -  Tired, decreased energy 3 -  Change in appetite 2 -  Feeling bad or failure about yourself  3 -  Trouble concentrating 3 -  Moving slowly or fidgety/restless 2 -  Suicidal thoughts  1 -  PHQ-9 Score 23 -  Difficult doing work/chores Very difficult -   GAD 7 : Generalized Anxiety Score 01/24/2018  Nervous, Anxious, on Edge 3  Control/stop worrying 3  Worry too much - different things 3  Trouble relaxing 3  Restless 2  Easily annoyed or irritable 3  Afraid - awful might happen 1  Total GAD 7 Score 18  Anxiety Difficulty Very difficult   Mood Disorder Questionnaire -- negative, scanned into media   Past Medical History:  Diagnosis Date  . Anxiety      Social History   Socioeconomic History  . Marital status: Single    Spouse name: Not on file  . Number of children: Not on file  . Years of education: Not on file  . Highest education level: Not on file  Occupational History  . Not on file  Social Needs  . Financial resource strain: Not on file  . Food insecurity:    Worry: Not on file    Inability: Not on file  . Transportation needs:    Medical: Not on file    Non-medical: Not on file  Tobacco Use  . Smoking status: Never Smoker  . Smokeless tobacco: Never Used  Substance and Sexual Activity  . Alcohol use: Yes    Comment: Social   . Drug use: No  . Sexual activity: Yes  Partners: Male    Birth control/protection: IUD    Comment: Mirena 09-03-17   Lifestyle  . Physical activity:    Days per week: Not on file    Minutes per session: Not on file  . Stress: Not on file  Relationships  . Social connections:    Talks on phone: Not on file    Gets together: Not on file    Attends religious service: Not on file    Active member of club or organization: Not on file    Attends meetings of clubs or organizations: Not on file    Relationship status: Not on file  . Intimate partner violence:    Fear of current or ex partner: Not on file    Emotionally abused: Not on file    Physically abused: Not on file    Forced sexual activity: Not on file  Other Topics Concern  . Not on file  Social History Narrative  . Not on file    Past Surgical  History:  Procedure Laterality Date  . INTRAUTERINE DEVICE (IUD) INSERTION  09/03/2017   Mirena   . TONSILLECTOMY  04/2017    Family History  Problem Relation Age of Onset  . Deep vein thrombosis Father 45    No Known Allergies  Current Medications:   Current Outpatient Medications:  .  levonorgestrel (MIRENA) 20 MCG/24HR IUD, 1 each by Intrauterine route once., Disp: , Rfl:  .  sertraline (ZOLOFT) 25 MG tablet, Take 1 tablet (25 mg total) by mouth daily., Disp: 30 tablet, Rfl: 1   Review of Systems:   Review of Systems  Constitutional: Positive for fatigue. Negative for appetite change.  Neurological: Positive for headaches (everyday).  Psychiatric/Behavioral: Positive for decreased concentration and depression. Negative for suicidal ideas. The patient has insomnia (4-5 hours a night).     Vitals:   Vitals:   01/24/18 0754  BP: 130/80  Pulse: 84  Temp: 98.6 F (37 C)  TempSrc: Oral  SpO2: 98%  Weight: 168 lb (76.2 kg)  Height: 5' 5.25" (1.657 m)     Body mass index is 27.74 kg/m.  Physical Exam:   Physical Exam  Constitutional: She appears well-developed. She is irritable and cooperative.  Non-toxic appearance. She does not have a sickly appearance. She does not appear ill. No distress.  Cardiovascular: Normal rate, regular rhythm, S1 normal, S2 normal, normal heart sounds and normal pulses.  No LE edema  Pulmonary/Chest: Effort normal and breath sounds normal.  Neurological: She is alert. GCS eye subscore is 4. GCS verbal subscore is 5. GCS motor subscore is 6.  Skin: Skin is warm, dry and intact.  Psychiatric: She has a normal mood and affect. Her speech is normal and behavior is normal.  Nursing note and vitals reviewed.   Assessment and Plan:    Raihana was seen today for depression.  Diagnoses and all orders for this visit:  Depression, major, single episode, moderate (HCC) No red flags on exam. Denies current SI/HI. Mom is aware of patient's  mood. Labs to rule out organic etiology. Discussed finding a time to meet with therapist. Prioritize time to sleep, as she is not making this a priority and I feel as though it is exacerbating all of her symptoms. Start Zoloft 25 mg daily. Follow-up in 4-6 weeks. I discussed with patient that if they develop any SI, to tell someone immediately and seek medical attention. -     Comprehensive metabolic panel -  CBC with Differential/Platelet -     TSH  Other orders -     sertraline (ZOLOFT) 25 MG tablet; Take 1 tablet (25 mg total) by mouth daily.   . Reviewed expectations re: course of current medical issues. . Discussed self-management of symptoms. . Outlined signs and symptoms indicating need for more acute intervention. . Patient verbalized understanding and all questions were answered. . See orders for this visit as documented in the electronic medical record. . Patient received an After-Visit Summary.  CMA or LPN served as scribe during this visit. History, Physical, and Plan performed by medical provider. The above documentation has been reviewed and is accurate and complete.   Jarold Motto, PA-C

## 2018-01-24 ENCOUNTER — Ambulatory Visit: Payer: BC Managed Care – PPO | Admitting: Physician Assistant

## 2018-01-24 ENCOUNTER — Encounter: Payer: Self-pay | Admitting: Physician Assistant

## 2018-01-24 VITALS — BP 130/80 | HR 84 | Temp 98.6°F | Ht 65.25 in | Wt 168.0 lb

## 2018-01-24 DIAGNOSIS — F321 Major depressive disorder, single episode, moderate: Secondary | ICD-10-CM

## 2018-01-24 LAB — COMPREHENSIVE METABOLIC PANEL
ALK PHOS: 40 U/L (ref 39–117)
ALT: 8 U/L (ref 0–35)
AST: 12 U/L (ref 0–37)
Albumin: 4.3 g/dL (ref 3.5–5.2)
BILIRUBIN TOTAL: 0.4 mg/dL (ref 0.2–1.2)
BUN: 13 mg/dL (ref 6–23)
CALCIUM: 9.6 mg/dL (ref 8.4–10.5)
CO2: 26 meq/L (ref 19–32)
CREATININE: 0.97 mg/dL (ref 0.40–1.20)
Chloride: 104 mEq/L (ref 96–112)
GFR: 76.27 mL/min (ref >60.00)
GLUCOSE: 90 mg/dL (ref 70–99)
Potassium: 4.4 mEq/L (ref 3.5–5.1)
Sodium: 137 mEq/L (ref 135–145)
Total Protein: 7.5 g/dL (ref 6.0–8.3)

## 2018-01-24 LAB — CBC WITH DIFFERENTIAL/PLATELET
BASOS ABS: 0 10*3/uL (ref 0.0–0.1)
Basophils Relative: 0.4 % (ref 0.0–3.0)
Eosinophils Absolute: 0.2 10*3/uL (ref 0.0–0.7)
Eosinophils Relative: 2.1 % (ref 0.0–5.0)
HCT: 38.1 % (ref 36.0–46.0)
Hemoglobin: 13 g/dL (ref 12.0–15.0)
LYMPHS ABS: 0.8 10*3/uL (ref 0.7–4.0)
Lymphocytes Relative: 10.1 % — ABNORMAL LOW (ref 12.0–46.0)
MCHC: 34.2 g/dL (ref 30.0–36.0)
MCV: 89.6 fl (ref 78.0–100.0)
MONO ABS: 0.9 10*3/uL (ref 0.1–1.0)
MONOS PCT: 10.7 % (ref 3.0–12.0)
NEUTROS ABS: 6.2 10*3/uL (ref 1.4–7.7)
NEUTROS PCT: 76.7 % (ref 43.0–77.0)
PLATELETS: 292 10*3/uL (ref 150.0–400.0)
RBC: 4.26 Mil/uL (ref 3.87–5.11)
RDW: 12.9 % (ref 11.5–15.5)
WBC: 8.1 10*3/uL (ref 4.0–10.5)

## 2018-01-24 LAB — TSH: TSH: 1.17 u[IU]/mL (ref 0.35–4.50)

## 2018-01-24 MED ORDER — SERTRALINE HCL 25 MG PO TABS: 25.0000 mg | ORAL_TABLET | Freq: Every day | ORAL | 1 refills | Status: DC

## 2018-01-24 NOTE — Patient Instructions (Addendum)
It was great to see you!  Start Zoloft 25 mg daily.  Please work on finding a better sleep schedule for you.  Find a therapist. I really think you would like Lisa Flores here in our office if you cannot find one Colen Darlingon your own. If you would like to use her, we can reach out to her to see if we can get her on your schedule sooner rather than later -- just let us know!  Let's follow-up in 6 weeks, sooner if you have concerns.  Take care,  Jarold MottoSamantha Nikolos Billig [facility]-C  National Suicide Hotline Call [phone]

## 2018-02-05 ENCOUNTER — Ambulatory Visit: Payer: BC Managed Care – PPO | Admitting: Family Medicine

## 2018-03-25 NOTE — Progress Notes (Deleted)
Jacqueline Giles is a 22 y.o. female is here for follow up.  History of Present Illness:   {CMA SCRIBE ATTESTATION}  HPI:   There are no preventive care reminders to display for this patient. Depression screen [facility] 2/9 01/24/2018 02/21/2017  Decreased Interest 3 0  Down, Depressed, Hopeless 3 0  PHQ - 2 Score 6 0  Altered sleeping 3 -  Tired, decreased energy 3 -  Change in appetite 2 -  Feeling bad or failure about yourself  3 -  Trouble concentrating 3 -  Moving slowly or fidgety/restless 2 -  Suicidal thoughts 1 -  PHQ-9 Score 23 -  Difficult doing work/chores Very difficult -   PMHx, SurgHx, SocialHx, FamHx, Medications, and Allergies were reviewed in the Visit Navigator and updated as appropriate.  There are no active problems to display for this patient.  Social History   Tobacco Use  . Smoking status: Never Smoker  . Smokeless tobacco: Never Used  Substance Use Topics  . Alcohol use: Yes    Comment: Social   . Drug use: No   Current Medications and Allergies:   Current Outpatient Medications:  .  levonorgestrel (MIRENA) 20 MCG/24HR IUD, 1 each by Intrauterine route once., Disp: , Rfl:  .  sertraline (ZOLOFT) 25 MG tablet, Take 1 tablet (25 mg total) by mouth daily., Disp: 30 tablet, Rfl: 1  No Known Allergies Review of Systems   Pertinent items are noted in the HPI. Otherwise, ROS is negative.  Vitals:  There were no vitals filed for this visit.   There is no height or weight on file to calculate BMI.  Physical Exam:   Physical Exam  Results for orders placed or performed in visit on 01/24/18  Comprehensive metabolic panel  Result Value Ref Range   Sodium 137 135 - 145 mEq/L   Potassium 4.4 3.5 - 5.1 mEq/L   Chloride 104 96 - 112 mEq/L   CO2 26 19 - 32 mEq/L   Glucose, Bld 90 70 - 99 mg/dL   BUN 13 6 - 23 mg/dL   Creatinine, Ser 1.61 0.40 - 1.20 mg/dL   Total Bilirubin 0.4 0.2 - 1.2 mg/dL   Alkaline Phosphatase 40 39 - 117 U/L   AST 12 0 - 37  U/L   ALT 8 0 - 35 U/L   Total Protein 7.5 6.0 - 8.3 g/dL   Albumin 4.3 3.5 - 5.2 g/dL   Calcium 9.6 8.4 - 09.6 mg/dL   GFR 04.54 >09.81 mL/min  CBC with Differential/Platelet  Result Value Ref Range   WBC 8.1 4.0 - 10.5 K/uL   RBC 4.26 3.87 - 5.11 Mil/uL   Hemoglobin 13.0 12.0 - 15.0 g/dL   HCT 19.1 47.8 - 29.5 %   MCV 89.6 78.0 - 100.0 fl   MCHC 34.2 30.0 - 36.0 g/dL   RDW 62.1 30.8 - 65.7 %   Platelets 292.0 150.0 - 400.0 K/uL   Neutrophils Relative % 76.7 43.0 - 77.0 %   Lymphocytes Relative 10.1 (L) 12.0 - 46.0 %   Monocytes Relative 10.7 3.0 - 12.0 %   Eosinophils Relative 2.1 0.0 - 5.0 %   Basophils Relative 0.4 0.0 - 3.0 %   Neutro Abs 6.2 1.4 - 7.7 K/uL   Lymphs Abs 0.8 0.7 - 4.0 K/uL   Monocytes Absolute 0.9 0.1 - 1.0 K/uL   Eosinophils Absolute 0.2 0.0 - 0.7 K/uL   Basophils Absolute 0.0 0.0 - 0.1 K/uL  TSH  Result Value Ref Range   TSH 1.17 0.35 - 4.50 uIU/mL    Assessment and Plan:   There are no diagnoses linked to this encounter.  . Reviewed expectations re: course of current medical issues. . Discussed self-management of symptoms. . Outlined signs and symptoms indicating need for more acute intervention. . Patient verbalized understanding and all questions were answered. Marland Kitchen. Health Maintenance issues including appropriate healthy diet, exercise, and smoking avoidance were discussed with patient. . See orders for this visit as documented in the electronic medical record. . Patient received an After Visit Summary.  *** CMA served as Neurosurgeonscribe during this visit. History, Physical, and Plan performed by medical provider. The above documentation has been reviewed and is accurate and complete. Helane RimaErica Kaylany Tesoriero, D.O.  Helane RimaErica Brallan Denio, DO [place], [address] [facility] 03/25/2018

## 2018-03-26 ENCOUNTER — Ambulatory Visit: Payer: BC Managed Care – PPO | Admitting: Family Medicine

## 2018-04-15 ENCOUNTER — Ambulatory Visit (INDEPENDENT_AMBULATORY_CARE_PROVIDER_SITE_OTHER): Admitting: Family Medicine

## 2018-04-15 ENCOUNTER — Encounter: Payer: Self-pay | Admitting: Family Medicine

## 2018-04-15 VITALS — BP 124/72 | HR 97 | Temp 98.3°F | Ht 65.25 in | Wt 165.2 lb

## 2018-04-15 DIAGNOSIS — F321 Major depressive disorder, single episode, moderate: Secondary | ICD-10-CM | POA: Diagnosis not present

## 2018-04-15 DIAGNOSIS — Z23 Encounter for immunization: Secondary | ICD-10-CM

## 2018-04-15 MED ORDER — SERTRALINE HCL 25 MG PO TABS: 25.0000 mg | ORAL_TABLET | Freq: Every day | ORAL | 3 refills | Status: DC

## 2018-04-15 NOTE — Progress Notes (Signed)
Jacqueline Giles is a 22 y.o. female is here for follow up.  History of Present Illness:   Jacqueline Giles, Jacqueline Giles acting as scribe for Jacqueline Giles.   HPI: Patient in for follow up on medications. She was started on Zoloft 25mg  at [facility]. She has been taking daily. She can tell a big improvement with use. She does notice sleepier during the day. She is getting only about 4 hours a sleep during the day due to school and work. She has not started with therapy yet but she will start after she finishes this semester at school.   There are no preventive care reminders to display for this patient.   [facility] 2/9 04/15/2018 01/24/2018 02/21/2017  Decreased Interest 0 3 0  Down, Depressed, Hopeless 0 3 0  PHQ - 2 Score 0 6 0  Altered sleeping 1 3 -  Tired, decreased energy 2 3 -  Change in appetite 0 2 -  Feeling bad or failure about yourself  0 3 -  Trouble concentrating 0 3 -  Moving slowly or fidgety/restless 0 2 -  Suicidal thoughts 0 1 -  PHQ-9 Score 3 23 -  Difficult doing work/chores Not difficult at all Very difficult -   PMHx, SurgHx, SocialHx, FamHx, Medications, and Allergies were reviewed in the Visit Navigator and updated as appropriate.   Patient Active Problem List   Diagnosis Date Noted  . Depression, major, single episode, moderate (HCC) 04/15/2018   Social History   Tobacco Use  . Smoking status: Never Smoker  . Smokeless tobacco: Never Used  Substance Use Topics  . Alcohol use: Yes    Comment: Social   . Drug use: No   Current Medications and Allergies:   Current Outpatient Medications:  .  levonorgestrel (MIRENA) 20 MCG/24HR IUD, 1 each by Intrauterine route once., Disp: , Rfl:  .  sertraline (ZOLOFT) 25 MG tablet, Take 1 tablet (25 mg total) by mouth at bedtime., Disp: 90 tablet, Rfl: 3  No Known Allergies Review of Systems   Pertinent items are noted in the HPI. Otherwise, a complete ROS is negative.  Vitals:    Vitals:   04/15/18 1410  BP: 124/72  Pulse: 97  Temp: 98.3 F (36.8 C)  TempSrc: Oral  SpO2: 99%  Weight: 165 lb 3.2 oz (74.9 kg)  Height: 5' 5.25" (1.657 m)     Body mass index is 27.28 kg/m.  Physical Exam:   Physical Exam Vitals signs and nursing note reviewed.  HENT:     Head: Normocephalic and atraumatic.  Eyes:     Pupils: Pupils are equal, round, and reactive to light.  Neck:     Musculoskeletal: Normal range of motion and neck supple.  Cardiovascular:     Rate and Rhythm: Normal rate and regular rhythm.     Heart sounds: Normal heart sounds.  Pulmonary:     Effort: Pulmonary effort is normal.  Abdominal:     Palpations: Abdomen is soft.  Skin:    General: Skin is warm.  Psychiatric:        Behavior: Behavior normal.    Assessment and Plan:   Jacqueline Giles was seen today for follow-up.  Diagnoses and all orders for this visit:  Depression, major, single episode, moderate (HCC) Comments: Much improved. Continue current treatment.  Orders: -     sertraline (ZOLOFT) 25 MG tablet; Take 1 tablet (25 mg total) by mouth at bedtime.  Need for immunization against  influenza -     Cancel: Flu Vaccine QUAD 36+ mos IM   . Orders and follow up as documented in EpicCare, reviewed diet, exercise and weight control, cardiovascular risk and specific lipid/LDL goals reviewed, reviewed medications and side effects in detail.  . Reviewed expectations re: course of current medical issues. . Outlined signs and symptoms indicating need for more acute intervention. . Patient verbalized understanding and all questions were answered. . Patient received an After Visit Summary.  Jacqueline Giles served as Neurosurgeon during this visit. History, Physical, and Plan performed by medical provider. The above documentation has been reviewed and is accurate and complete. Jacqueline Giles, D.O.  Jacqueline Rima, Jacqueline Giles [place], [facility] 04/20/2018

## 2018-04-20 ENCOUNTER — Encounter: Payer: Self-pay | Admitting: Family Medicine

## 2018-08-06 ENCOUNTER — Ambulatory Visit: Payer: BC Managed Care – PPO | Admitting: Obstetrics and Gynecology

## 2018-10-24 ENCOUNTER — Telehealth: Payer: Self-pay | Admitting: Obstetrics and Gynecology

## 2018-10-24 NOTE — Telephone Encounter (Signed)
Left message on voicemail to call and reschedule cancelled appointment. °

## 2018-10-28 ENCOUNTER — Ambulatory Visit: Payer: BC Managed Care – PPO | Admitting: Obstetrics and Gynecology

## 2018-11-11 ENCOUNTER — Ambulatory Visit: Admitting: Obstetrics and Gynecology

## 2019-03-16 ENCOUNTER — Other Ambulatory Visit: Payer: Self-pay

## 2019-03-17 ENCOUNTER — Encounter: Payer: Self-pay | Admitting: Obstetrics and Gynecology

## 2019-03-17 ENCOUNTER — Ambulatory Visit (INDEPENDENT_AMBULATORY_CARE_PROVIDER_SITE_OTHER): Admitting: Obstetrics and Gynecology

## 2019-03-17 VITALS — BP 126/82 | HR 76 | Temp 97.9°F | Wt 176.4 lb

## 2019-03-17 DIAGNOSIS — N760 Acute vaginitis: Secondary | ICD-10-CM

## 2019-03-17 DIAGNOSIS — Z113 Encounter for screening for infections with a predominantly sexual mode of transmission: Secondary | ICD-10-CM

## 2019-03-17 DIAGNOSIS — B9689 Other specified bacterial agents as the cause of diseases classified elsewhere: Secondary | ICD-10-CM

## 2019-03-17 DIAGNOSIS — R109 Unspecified abdominal pain: Secondary | ICD-10-CM

## 2019-03-17 LAB — POCT URINALYSIS DIPSTICK
Bilirubin, UA: NEGATIVE
Blood, UA: NEGATIVE
Glucose, UA: NEGATIVE
Ketones, UA: NEGATIVE
Nitrite, UA: NEGATIVE
Protein, UA: NEGATIVE
Spec Grav, UA: 1.01 (ref 1.010–1.025)
Urobilinogen, UA: 0.2 E.U./dL
pH, UA: 5 (ref 5.0–8.0)

## 2019-03-17 MED ORDER — METRONIDAZOLE 500 MG PO TABS: 500.0000 mg | ORAL_TABLET | Freq: Two times a day (BID) | ORAL | 0 refills | Status: DC

## 2019-03-17 NOTE — Progress Notes (Signed)
GYNECOLOGY  VISIT   HPI: 23 y.o.   Single White or Caucasian Not Hispanic or Latino  female   [id number] with No LMP recorded. (Menstrual status: IUD).   here for vaginitis. Reports vaginal itching and white discharge that began 2 weeks ago. The itching is mild. The discharge is thin, + odor. No STD concerns.   She has intermittent flank pain on the right. Worse with exercise. No help with ibuprofen.   GYNECOLOGIC HISTORY: No LMP recorded. (Menstrual status: IUD). Contraception: IUD Menopausal hormone therapy: None        OB History    Gravida  0   Para  0   Term  0   Preterm  0   AB  0   Living  0     SAB  0   TAB  0   Ectopic  0   Multiple  0   Live Births  0              Patient Active Problem List   Diagnosis Date Noted  . Depression, major, single episode, moderate ([place]) 04/15/2018    Past Medical History:  Diagnosis Date  . Anxiety     Past Surgical History:  Procedure Laterality Date  . INTRAUTERINE DEVICE (IUD) INSERTION  09/03/2017   Mirena   . TONSILLECTOMY  04/2017    Current Outpatient Medications  Medication Sig Dispense Refill  . levonorgestrel (MIRENA) 20 MCG/24HR IUD 1 each by Intrauterine route once.    . sertraline (ZOLOFT) 25 MG tablet Take 25 mg by mouth daily.     No current facility-administered medications for this visit.      ALLERGIES: Patient has no known allergies.  Family History  Problem Relation Age of Onset  . Deep vein thrombosis Father 42    Social History   Socioeconomic History  . Marital status: Single    Spouse name: Not on file  . Number of children: Not on file  . Years of education: Not on file  . Highest education level: Not on file  Occupational History  . Not on file  Social Needs  . Financial resource strain: Not on file  . Food insecurity    Worry: Not on file    Inability: Not on file  . Transportation needs    Medical: Not on file    Non-medical: Not on file  Tobacco Use  .  Smoking status: Never Smoker  . Smokeless tobacco: Never Used  Substance and Sexual Activity  . Alcohol use: Yes    Comment: Social   . Drug use: No  . Sexual activity: Yes    Partners: Male    Birth control/protection: I.U.D.    Comment: Mirena 09-03-17   Lifestyle  . Physical activity    Days per week: Not on file    Minutes per session: Not on file  . Stress: Not on file  Relationships  . Social Herbalist on phone: Not on file    Gets together: Not on file    Attends religious service: Not on file    Active member of club or organization: Not on file    Attends meetings of clubs or organizations: Not on file    Relationship status: Not on file  . Intimate partner violence    Fear of current or ex partner: Not on file    Emotionally abused: Not on file    Physically abused: Not on file    Forced  sexual activity: Not on file  Other Topics Concern  . Not on file  Social History Narrative  . Not on file    Review of Systems  Constitutional: Negative.   HENT: Negative.   Eyes: Negative.   Respiratory: Negative.   Cardiovascular: Negative.   Gastrointestinal: Negative.   Genitourinary: Positive for flank pain and frequency.       Vaginal discharge Vaginal itching  Skin: Negative.   Neurological: Negative.   Endo/Heme/Allergies: Negative.   Psychiatric/Behavioral: Negative.     PHYSICAL EXAMINATION:    BP 126/82 (BP Location: Right Arm, Patient Position: Sitting, Cuff Size: Normal)   Pulse 76   Temp 97.9 F (36.6 C) (Skin)   Wt 176 lb 6.4 oz (80 kg)   BMI 29.13 kg/m     General appearance: alert, cooperative and appears stated age  Pelvic: External genitalia:  no lesions              Urethra:  normal appearing urethra with no masses, tenderness or lesions              Bartholins and Skenes: normal                 Vagina: normal appearing vagina with slight erythema and an increase in yellow, frothy vaginal discharge              Cervix: no  lesions, mild erythema, IUD string 3-4 cm  Chaperone was present for exam.  Wet prep: + clue, no trich, + wbc KOH: no yeast PH: 5  Urine dip: 2+ leuk, otherwise negative.   ASSESSMENT Bacterial vaginitis Screening std, d/c has some characteristics of trich H/O kidney stones, getting flank pain intermittently    PLAN Treat with flagyl (no ETOH) Send nuswab for GC/CT/trich She had her annual exam at [facility] earlier this year.   An After Visit Summary was printed and given to the patient.  ~15 minutes face to face time of which over 50% was spent in counseling.

## 2019-03-17 NOTE — Patient Instructions (Signed)
Musculoskeletal Pain Musculoskeletal pain refers to aches and pains in your bones, joints, muscles, and the tissues that surround them. This pain can occur in any part of the body. It can last for a short time (acute) or a long time (chronic). A physical exam, lab tests, and imaging studies may be done to find the cause of your musculoskeletal pain. Follow these instructions at home:  Lifestyle  Try to control or lower your stress levels. Stress increases muscle tension and can worsen musculoskeletal pain. It is important to recognize when you are anxious or stressed and learn ways to manage it. This may include: ? Meditation or yoga. ? Cognitive or behavioral therapy. ? Acupuncture or massage therapy.  You may continue all activities unless the activities cause more pain. When the pain gets better, slowly resume your normal activities. Gradually increase the intensity and duration of your activities or exercise. Managing pain, stiffness, and swelling  Take over-the-counter and prescription medicines only as told by your health care provider.  When your pain is severe, bed rest may be helpful. Lie or sit in any position that is comfortable, but get out of bed and walk around at least every couple of hours.  If directed, apply heat to the affected area as often as told by your health care provider. Use the heat source that your health care provider recommends, such as a moist heat pack or a heating pad. ? Place a towel between your skin and the heat source. ? Leave the heat on for 20-30 minutes. ? Remove the heat if your skin turns bright red. This is especially important if you are unable to feel pain, heat, or cold. You may have a greater risk of getting burned.  If directed, put ice on the painful area. ? Put ice in a plastic bag. ? Place a towel between your skin and the bag. ? Leave the ice on for 20 minutes, 2-3 times a day. General instructions  Your health care provider may  recommend that you see a physical therapist. This person can help you come up with a safe exercise program. Do any exercises as told by your physical therapist.  Keep all follow-up visits, including any physical therapy visits, as told by your health care providers. This is important. Contact a health care provider if:  Your pain gets worse.  Medicines do not help ease your pain.  You cannot use the part of your body that hurts, such as your arm, leg, or neck.  You have trouble sleeping.  You have trouble doing your normal activities. Get help right away if:  You have a new injury and your pain is worse or different.  You feel numb or you have tingling in the painful area. Summary  Musculoskeletal pain refers to aches and pains in your bones, joints, muscles, and the tissues that surround them.  This pain can occur in any part of the body.  Your health care provider may recommend that you see a physical therapist. This person can help you come up with a safe exercise program. Do any exercises as told by your physical therapist.  Lower your stress level. Stress can worsen musculoskeletal pain. Ways to lower stress may include meditation, yoga, cognitive or behavioral therapy, acupuncture, and massage therapy. This information is not intended to replace advice given to you by your health care provider. Make sure you discuss any questions you have with your health care provider. Document Released: 04/23/2005 Document Revised: 04/05/2017 Document Reviewed:  05/23/2016 Elsevier Patient Education  2020 [employer]. Vaginitis Vaginitis is a condition in which the vaginal tissue swells and becomes red (inflamed). This condition is most often caused by a change in the normal balance of bacteria and yeast that live in the vagina. This change causes an overgrowth of certain bacteria or yeast, which causes the inflammation. There are different types of vaginitis, but the most common types  are:  Bacterial vaginosis.  Yeast infection (candidiasis).  Trichomoniasis vaginitis. This is a sexually transmitted disease (STD).  Viral vaginitis.  Atrophic vaginitis.  Allergic vaginitis. What are the causes? The cause of this condition depends on the type of vaginitis. It can be caused by:  Bacteria (bacterial vaginosis).  Yeast, which is a fungus (yeast infection).  A parasite (trichomoniasis vaginitis).  A virus (viral vaginitis).  Low hormone levels (atrophic vaginitis). Low hormone levels can occur during pregnancy, breastfeeding, or after menopause.  Irritants, such as bubble baths, scented tampons, and feminine sprays (allergic vaginitis). Other factors can change the normal balance of the yeast and bacteria that live in the vagina. These include:  Antibiotic medicines.  Poor hygiene.  Diaphragms, vaginal sponges, spermicides, birth control pills, and intrauterine devices (IUD).  Sex.  Infection.  Uncontrolled diabetes.  A weakened defense (immune) system. What increases the risk? This condition is more likely to develop in women who:  Smoke.  Use vaginal douches, scented tampons, or scented sanitary pads.  Wear tight-fitting pants.  Wear thong underwear.  Use oral birth control pills or an IUD.  Have sex without a condom.  Have multiple sex partners.  Have an STD.  Frequently use the spermicide nonoxynol-9.  Eat lots of foods high in sugar.  Have uncontrolled diabetes.  Have low estrogen levels.  Have a weakened immune system from an immune disorder or medical treatment.  Are pregnant or breastfeeding. What are the signs or symptoms? Symptoms vary depending on the cause of the vaginitis. Common symptoms include:  Abnormal vaginal discharge. ? The discharge is white, gray, or yellow with bacterial vaginosis. ? The discharge is thick, white, and cheesy with a yeast infection. ? The discharge is frothy and yellow or greenish with  trichomoniasis.  A bad vaginal smell. The smell is fishy with bacterial vaginosis.  Vaginal itching, pain, or swelling.  Sex that is painful.  Pain or burning when urinating. Sometimes there are no symptoms. How is this diagnosed? This condition is diagnosed based on your symptoms and medical history. A physical exam, including a pelvic exam, will also be done. You may also have other tests, including:  Tests to determine the pH level (acidity or alkalinity) of your vagina.  A whiff test, to assess the odor that results when a sample of your vaginal discharge is mixed with a potassium hydroxide solution.  Tests of vaginal fluid. A sample will be examined under a microscope. How is this treated? Treatment varies depending on the type of vaginitis you have. Your treatment may include:  Antibiotic creams or pills to treat bacterial vaginosis and trichomoniasis.  Antifungal medicines, such as vaginal creams or suppositories, to treat a yeast infection.  Medicine to ease discomfort if you have viral vaginitis. Your sexual partner should also be treated.  Estrogen delivered in a cream, pill, suppository, or vaginal ring to treat atrophic vaginitis. If vaginal dryness occurs, lubricants and moisturizing creams may help. You may need to avoid scented soaps, sprays, or douches.  Stopping use of a product that is causing allergic vaginitis. Then  using a vaginal cream to treat the symptoms. Follow these instructions at home: Lifestyle  Keep your genital area clean and dry. Avoid soap, and only rinse the area with water.  Do not douche or use tampons until your health care provider says it is okay to do so. Use sanitary pads, if needed.  Do not have sex until your health care provider approves. When you can return to sex, practice safe sex and use condoms.  Wipe from front to back. This avoids the spread of bacteria from the rectum to the vagina. General instructions  Take  over-the-counter and prescription medicines only as told by your health care provider.  If you were prescribed an antibiotic medicine, take or use it as told by your health care provider. Do not stop taking or using the antibiotic even if you start to feel better.  Keep all follow-up visits as told by your health care provider. This is important. How is this prevented?  Use mild, non-scented products. Do not use things that can irritate the vagina, such as fabric softeners. Avoid the following products if they are scented: ? Feminine sprays. ? Detergents. ? Tampons. ? Feminine hygiene products. ? Soaps or bubble baths.  Let air reach your genital area. ? Wear cotton underwear to reduce moisture buildup. ? Avoid wearing underwear while you sleep. ? Avoid wearing tight pants and underwear or nylons without a cotton panel. ? Avoid wearing thong underwear.  Take off any wet clothing, such as bathing suits, as soon as possible.  Practice safe sex and use condoms. Contact a health care provider if:  You have abdominal pain.  You have a fever.  You have symptoms that last for more than 2-3 days. Get help right away if:  You have a fever and your symptoms suddenly get worse. Summary  Vaginitis is a condition in which the vaginal tissue becomes inflamed.This condition is most often caused by a change in the normal balance of bacteria and yeast that live in the vagina.  Treatment varies depending on the type of vaginitis you have.  Do not douche, use tampons , or have sex until your health care provider approves. When you can return to sex, practice safe sex and use condoms. This information is not intended to replace advice given to you by your health care provider. Make sure you discuss any questions you have with your health care provider. Document Released: 02/18/2007 Document Revised: 04/05/2017 Document Reviewed: 05/29/2016 Elsevier Patient Education  2020 [employer].

## 2019-03-18 LAB — RPR: RPR Ser Ql: NONREACTIVE

## 2019-03-18 LAB — HIV ANTIBODY (ROUTINE TESTING W REFLEX): HIV Screen 4th Generation wRfx: NONREACTIVE

## 2019-03-20 ENCOUNTER — Telehealth: Payer: Self-pay

## 2019-03-20 LAB — CHLAMYDIA/GONOCOCCUS/TRICHOMONAS, NAA
Chlamydia by NAA: POSITIVE — AB
Gonococcus by NAA: NEGATIVE
Trich vag by NAA: NEGATIVE

## 2019-03-20 MED ORDER — AZITHROMYCIN 250 MG PO TABS: 1000.0000 mg | ORAL_TABLET | Freq: Once | ORAL | 0 refills | Status: AC

## 2019-03-20 NOTE — Telephone Encounter (Signed)
Called pt re: + Chlamydia and treatment plan.  Left message to call back to triage nurse.

## 2019-03-20 NOTE — Telephone Encounter (Signed)
-----   Message from Salvadore Dom, MD sent at 03/20/2019 12:03 PM EST ----- Please inform the patient that she has chlamydia and treat with azithromycin 1 gram po x 1. Please offer expedited partner therapy. They should avoid intercourse until one week after they have both been treated, then use condoms. She needs a f/u cervical culture in 3 months.

## 2019-03-20 NOTE — Telephone Encounter (Signed)
Patient returned call to go over results. 

## 2019-03-20 NOTE — Telephone Encounter (Signed)
Spoke with pt. Pt notified of lab results for positive Chlamydia.  Sent Azithromycin 1 gm , PO x 1 to pharmacy on file. Pt waiting to talk with partner for EPT. Pt to call back if needs Rx for partner.  Routing to provider for final review. Patient is agreeable to disposition. Will close encounter.

## 2019-03-23 ENCOUNTER — Telehealth: Payer: Self-pay | Admitting: Obstetrics and Gynecology

## 2019-03-23 NOTE — Telephone Encounter (Signed)
Spoke with patient. Patient requesting EPT, see 03/17/19 result note. Partner information provided, patient is aware Rx will be placed at front office for pick up. TOC scheduled for 06/24/19 at 3:30pm with Dr. Talbert Nan. Patient verbalizes understanding and is agreeable.   Routing to provider for final review. Patient is agreeable to disposition. Will close encounter.

## 2019-03-23 NOTE — Telephone Encounter (Signed)
Patient was offered a prescription for her partner and would like to come by and pick this up. She also has a few questions for a nurse.

## 2019-03-31 ENCOUNTER — Encounter: Admitting: Physician Assistant

## 2019-03-31 NOTE — Progress Notes (Deleted)
Jacqueline Giles is a 23 y.o. female is here for transfer care.  I acted as a Education administrator for [employer], PA-C [employer], LPN  History of Present Illness:   No chief complaint on file.   HPI Pt here today to transfer care from Dr. Juleen China.  . There are no preventive care reminders to display for this patient.  Past Medical History:  Diagnosis Date  . Anxiety      Social History   Socioeconomic History  . Marital status: Single    Spouse name: Not on file  . Number of children: Not on file  . Years of education: Not on file  . Highest education level: Not on file  Occupational History  . Not on file  Social Needs  . Financial resource strain: Not on file  . Food insecurity    Worry: Not on file    Inability: Not on file  . Transportation needs    Medical: Not on file    Non-medical: Not on file  Tobacco Use  . Smoking status: Never Smoker  . Smokeless tobacco: Never Used  Substance and Sexual Activity  . Alcohol use: Yes    Comment: Social   . Drug use: No  . Sexual activity: Yes    Partners: Male    Birth control/protection: I.U.D.    Comment: Mirena 09-03-17   Lifestyle  . Physical activity    Days per week: Not on file    Minutes per session: Not on file  . Stress: Not on file  Relationships  . Social Herbalist on phone: Not on file    Gets together: Not on file    Attends religious service: Not on file    Active member of club or organization: Not on file    Attends meetings of clubs or organizations: Not on file    Relationship status: Not on file  . Intimate partner violence    Fear of current or ex partner: Not on file    Emotionally abused: Not on file    Physically abused: Not on file    Forced sexual activity: Not on file  Other Topics Concern  . Not on file  Social History Narrative  . Not on file    Past Surgical History:  Procedure Laterality Date  . INTRAUTERINE DEVICE (IUD) INSERTION  09/03/2017   Mirena   .  TONSILLECTOMY  04/2017    Family History  Problem Relation Age of Onset  . Deep vein thrombosis Father 12    PMHx, SurgHx, SocialHx, FamHx, Medications, and Allergies were reviewed in the Visit Navigator and updated as appropriate.   Patient Active Problem List   Diagnosis Date Noted  . Depression, major, single episode, moderate ([place]) 04/15/2018    Social History   Tobacco Use  . Smoking status: Never Smoker  . Smokeless tobacco: Never Used  Substance Use Topics  . Alcohol use: Yes    Comment: Social   . Drug use: No    Current Medications and Allergies:    Current Outpatient Medications:  .  levonorgestrel (MIRENA) 20 MCG/24HR IUD, 1 each by Intrauterine route once., Disp: , Rfl:  .  metroNIDAZOLE (FLAGYL) 500 MG tablet, Take 1 tablet (500 mg total) by mouth 2 (two) times daily., Disp: 14 tablet, Rfl: 0 .  sertraline (ZOLOFT) 25 MG tablet, Take 25 mg by mouth daily., Disp: , Rfl:   No Known Allergies  Review of Systems   ROS  Vitals:  There were no vitals filed for this visit.   There is no height or weight on file to calculate BMI.   Physical Exam:    Physical Exam   Assessment and Plan:    There are no diagnoses linked to this encounter.  . Reviewed expectations re: course of current medical issues. . Discussed self-management of symptoms. . Outlined signs and symptoms indicating need for more acute intervention. . Patient verbalized understanding and all questions were answered. . See orders for this visit as documented in the electronic medical record. . Patient received an After Visit Summary.  ***  Jarold Motto, PA-C [place] [place] 03/31/2019  Follow-up: No follow-ups on file.

## 2019-04-07 ENCOUNTER — Encounter: Payer: Self-pay | Admitting: Physician Assistant

## 2019-04-26 ENCOUNTER — Ambulatory Visit
Admission: EM | Admit: 2019-04-26 | Discharge: 2019-04-26 | Disposition: A | Discharge status: Home / Self Care | Attending: Physician Assistant | Admitting: Physician Assistant

## 2019-04-26 ENCOUNTER — Other Ambulatory Visit: Payer: Self-pay

## 2019-04-26 DIAGNOSIS — J069 Acute upper respiratory infection, unspecified: Secondary | ICD-10-CM | POA: Diagnosis not present

## 2019-04-26 DIAGNOSIS — Z20828 Contact with and (suspected) exposure to other viral communicable diseases: Secondary | ICD-10-CM

## 2019-04-26 DIAGNOSIS — J029 Acute pharyngitis, unspecified: Secondary | ICD-10-CM

## 2019-04-26 LAB — POCT RAPID STREP A (OFFICE): Rapid Strep A Screen: NEGATIVE

## 2019-04-26 NOTE — Discharge Instructions (Signed)
Rapid strep negative. COVID PCR testing ordered. I would like you to quarantine until testing results. You can take over the counter flonase/nasacort to help with nasal congestion/drainage. If experiencing shortness of breath, trouble breathing, go to the emergency department for further evaluation needed.

## 2019-04-26 NOTE — ED Provider Notes (Signed)
[facility]    CSN: [id number] Arrival date & time: 04/26/19  1247      History   Chief Complaint Chief Complaint  Patient presents with  . Sore Throat    HPI Jacqueline Giles is a 23 y.o. female.   23 year old female comes in for 2 day of URI symptoms. Sore throat, nasal congestion, cough, chills, body aches. No known fever.  Denies abdominal pain, nausea, vomiting, diarrhea. Denies shortness of breath, loss of taste/smell. Brother positive for strep. Never smoker. Ibuprofen without relief. H/o tonsillectomy.      Past Medical History:  Diagnosis Date  . Anxiety     Patient Active Problem List   Diagnosis Date Noted  . Depression, major, single episode, moderate ([place]) 04/15/2018    Past Surgical History:  Procedure Laterality Date  . INTRAUTERINE DEVICE (IUD) INSERTION  09/03/2017   Mirena   . TONSILLECTOMY  04/2017    OB History    Gravida  0   Para  0   Term  0   Preterm  0   AB  0   Living  0     SAB  0   TAB  0   Ectopic  0   Multiple  0   Live Births  0            Home Medications    Prior to Admission medications   Medication Sig Start Date End Date Taking? Authorizing Provider  levonorgestrel (MIRENA) 20 MCG/24HR IUD 1 each by Intrauterine route once.    [provider]  sertraline (ZOLOFT) 25 MG tablet Take 25 mg by mouth daily.    [provider]    Family History Family History  Problem Relation Age of Onset  . Deep vein thrombosis Father 8    Social History Social History   Tobacco Use  . Smoking status: Never Smoker  . Smokeless tobacco: Never Used  Substance Use Topics  . Alcohol use: Yes    Comment: Social   . Drug use: No     Allergies   Patient has no known allergies.   Review of Systems Review of Systems  Reason unable to perform ROS: See HPI as above.     Physical Exam Triage Vital Signs ED Triage Vitals [04/26/19 1338]  Enc Vitals Group     BP 117/66   Pulse      Resp 16     Temp 98.2 F (36.8 C)     Temp Source Oral     SpO2 98 %     Weight      Height      Head Circumference      Peak Flow      Pain Score 0     Pain Loc      Pain Edu?      Excl. in [place]?    No data found.  Updated Vital Signs BP 117/66   Temp 98.2 F (36.8 C) (Oral)   Resp 16   SpO2 98%   Physical Exam Constitutional:      General: She is not in acute distress.    Appearance: Normal appearance. She is not ill-appearing, toxic-appearing or diaphoretic.  HENT:     Head: Normocephalic and atraumatic.     Nose:     Right Sinus: No maxillary sinus tenderness or frontal sinus tenderness.     Left Sinus: No maxillary sinus tenderness or frontal sinus tenderness.     Mouth/Throat:  Mouth: Mucous membranes are moist.     Pharynx: Oropharynx is clear. Uvula midline.  Cardiovascular:     Rate and Rhythm: Normal rate and regular rhythm.     Heart sounds: Normal heart sounds. No murmur. No friction rub. No gallop.   Pulmonary:     Effort: Pulmonary effort is normal. No accessory muscle usage, prolonged expiration, respiratory distress or retractions.     Comments: Lungs clear to auscultation without adventitious lung sounds. Musculoskeletal:     Cervical back: Normal range of motion and neck supple.  Neurological:     General: No focal deficit present.     Mental Status: She is alert and oriented to person, place, and time.      UC Treatments / Results  Labs (all labs ordered are listed, but only abnormal results are displayed) Labs Reviewed  POCT RAPID STREP A (OFFICE) - Normal  NOVEL CORONAVIRUS, NAA  CULTURE, GROUP A STREP [facility])    EKG   Radiology No results found.  Procedures Procedures (including critical care time)  Medications Ordered in UC Medications - No data to display  Initial Impression / Assessment and Plan / UC Course  I have reviewed the triage vital signs and the nursing notes.  Pertinent labs & imaging results that  were available during my care of the patient were reviewed by me and considered in my medical decision making (see chart for details).    Rapid strep negative. COVID PCR test ordered. Patient to quarantine until testing results return. No alarming signs on exam.  Patient speaking in full sentences without respiratory distress.  Symptomatic treatment discussed.  Push fluids.  Return precautions given.  Patient expresses understanding and agrees to plan.  Final Clinical Impressions(s) / UC Diagnoses   Final diagnoses:  Sore throat  Acute URI    ED Prescriptions    None     PDMP not reviewed this encounter.   Belinda Fisher, PA-C 04/26/19 [phone]

## 2019-04-26 NOTE — ED Triage Notes (Signed)
Pt c/o sore throat since yesterday morning. States her 69yr old brother was tested positive for strep last monday

## 2019-04-27 ENCOUNTER — Telehealth: Payer: Self-pay | Admitting: Obstetrics and Gynecology

## 2019-04-27 LAB — NOVEL CORONAVIRUS, NAA: SARS-CoV-2, NAA: NOT DETECTED

## 2019-04-27 NOTE — Telephone Encounter (Signed)
Appointment Request From: Judie Petit    With Provider: Salvadore Dom, MD [facility]]    Preferred Date Range: 04/28/2019 - 04/30/2019    Preferred Times: Any Time    Reason for visit: Office Visit    Comments:  Would like to know if I should take the 1st antibiotic I was prescribed. I have finished the 2nd one prescribed however my symptoms from my last visit have not gone away and are becoming concerning.

## 2019-04-27 NOTE — Telephone Encounter (Signed)
Left message for pt to call Colletta Maryland, RN in triage.

## 2019-04-28 NOTE — Telephone Encounter (Signed)
Spoke to pt. Pt completed the Zithromax on 03/20/19  but then was prescribed Flagyl on 03/17/19 and wants to know if should take it now since having sx of still odor and discharge?? Has filled Rx at home. Or should she come in to be seen for OV? Pt has 3 month TOC appt on 06/24/2019. States partner has been treated for +Chlaymdia.   Will route to Dr Talbert Nan for recommendations. Please advise. Will return call to pt. Pt agreeable.

## 2019-04-28 NOTE — Telephone Encounter (Signed)
Patient returning call to Stephanie.  

## 2019-04-28 NOTE — Telephone Encounter (Signed)
She was diagnosed with BV at her visit, the medication for the chlamydia doesn't treat the BV. She does need to take the Flagyl.

## 2019-04-28 NOTE — Telephone Encounter (Signed)
Spoke to pt. Pt updated on to take Flagyl to help treat BV. ETOH precautions given. Pt agreeable. Will call back if continues sx after treatment. Pt verbalized understanding.   Will route to Dr Talbert Nan for final review and will close encounter.

## 2019-04-29 LAB — CULTURE, GROUP A STREP (THRC)

## 2019-06-23 ENCOUNTER — Other Ambulatory Visit: Payer: Self-pay

## 2019-06-24 ENCOUNTER — Encounter: Payer: Self-pay | Admitting: Obstetrics and Gynecology

## 2019-06-24 ENCOUNTER — Ambulatory Visit (INDEPENDENT_AMBULATORY_CARE_PROVIDER_SITE_OTHER): Admitting: Obstetrics and Gynecology

## 2019-06-24 VITALS — BP 110/74 | HR 72 | Temp 97.6°F | Resp 14 | Ht 65.0 in | Wt 182.4 lb

## 2019-06-24 DIAGNOSIS — B9689 Other specified bacterial agents as the cause of diseases classified elsewhere: Secondary | ICD-10-CM | POA: Diagnosis not present

## 2019-06-24 DIAGNOSIS — Z8619 Personal history of other infectious and parasitic diseases: Secondary | ICD-10-CM | POA: Diagnosis not present

## 2019-06-24 DIAGNOSIS — N760 Acute vaginitis: Secondary | ICD-10-CM | POA: Diagnosis not present

## 2019-06-24 DIAGNOSIS — N898 Other specified noninflammatory disorders of vagina: Secondary | ICD-10-CM | POA: Diagnosis not present

## 2019-06-24 MED ORDER — METRONIDAZOLE 0.75 % VA GEL: 1.0000 | Freq: Every day | VAGINAL | 0 refills | Status: DC

## 2019-06-24 NOTE — Patient Instructions (Addendum)
Chlamydia, Female Chlamydia is an STD (sexually transmitted disease). It is a bacterial infection that spreads (is contagious) through sexual contact. Chlamydia can occur in different areas of the body, including:  The tube that moves urine from the bladder out of the body (urethra).  The lower part of the uterus (cervix).  The throat.  The rectum. This condition is not difficult to treat. However, if left untreated, chlamydia can lead to more serious health problems, including pelvic inflammatory disorder (PID). PID can increase your risk of not being able to have children (sterility). Also, if chlamydia is left untreated and you are pregnant or become pregnant, there is a chance that your baby can become infected during delivery. This may cause serious health problems for the baby. What are the causes? Chlamydia is caused by the bacteria Chlamydia trachomatis. It is passed from an infected partner during sexual activity. Chlamydia can spread through contact with the genitals, mouth, or rectum. What are the signs or symptoms? In some cases, there may not be any symptoms for this condition (asymptomatic), especially early in the infection. If symptoms develop, they may include:  Burning with urination.  Frequent urination.  Vaginal discharge.  Redness, soreness, and swelling (inflammation) of the rectum.  Bleeding or discharge from the rectum.  Abdominal pain.  Pain during sexual intercourse.  Bleeding between menstrual periods.  Itching, burning, or redness in the eyes, or discharge from the eyes. How is this diagnosed? This condition may be diagnosed with:  Urine tests.  Swab tests. Depending on your symptoms, your health care provider may use a cotton swab to collect discharge from your vagina or rectum to test for the bacteria.  A pelvic exam. How is this treated? This condition is treated with antibiotic medicines. If you are pregnant, certain types of antibiotics will  need to be avoided. Follow these instructions at home: Medicines  Take over-the-counter and prescription medicines only as told by your health care provider.  Take your antibiotic medicine as told by your health care provider. Do not stop taking the antibiotic even if you start to feel better. Sexual activity  Tell sexual partners about your infection. This includes any oral, anal, or vaginal sex partners you have had within 60 days of when your symptoms started. Sexual partners should also be treated, even if they have no signs of the disease.  Do not have sex until you and your sexual partners have completed treatment and your health care provider says it is okay. If your health care provider prescribed you a single dose treatment, wait 7 days after taking the treatment before having sex. General instructions  It is your responsibility to get your test results. Ask your health care provider, or the department performing the test, when your results will be ready.  Get plenty of rest.  Eat a healthy, well-balanced diet.  Drink enough fluids to keep your urine clear or pale yellow.  Keep all follow-up visits as told by your health care provider. This is important. You may need to be tested for infection again 3 months after treatment. How is this prevented? The only sure way to prevent chlamydia is to avoid having sex. However, you can lower your risk by:  Using latex condoms correctly every time you have sex.  Not having multiple sexual partners.  Asking if your sexual partner has been tested for STIs and had negative results. Contact a health care provider if:  You develop new symptoms or your symptoms do not get   better after completing treatment.  You have a fever or chills.  You have pain during sexual intercourse. Get help right away if:  Your pain gets worse and does not get better with medicine.  You develop flu-like symptoms, such as night sweats, sore throat, or  muscle aches.  You experience nausea or vomiting.  You have difficulty swallowing.  You have bleeding between periods or after sex.  You have irregular menstrual periods.  You have abdominal or lower back pain that does not get better with medicine.  You feel weak or dizzy, or you faint.  You are pregnant and you develop symptoms of chlamydia. Summary  Chlamydia is an STD (sexually transmitted disease). It is a bacterial infection that spreads (is contagious) through sexual contact.  This condition is not difficult to treat, however. If left untreated, chlamydia can lead to more serious health problems, including pelvic inflammatory disease (PID).  In some cases, there may not be any symptoms for this condition (asymptomatic).  This condition is treated with antibiotic medicines.  Using latex condoms correctly every time you have sex can help prevent chlamydia. This information is not intended to replace advice given to you by your health care provider. Make sure you discuss any questions you have with your health care provider. Document Revised: 10/15/2017 Document Reviewed: 04/09/2016 Elsevier Patient Education  2020 [employer].   Vaginitis Vaginitis is a condition in which the vaginal tissue swells and becomes red (inflamed). This condition is most often caused by a change in the normal balance of bacteria and yeast that live in the vagina. This change causes an overgrowth of certain bacteria or yeast, which causes the inflammation. There are different types of vaginitis, but the most common types are:  Bacterial vaginosis.  Yeast infection (candidiasis).  Trichomoniasis vaginitis. This is a sexually transmitted disease (STD).  Viral vaginitis.  Atrophic vaginitis.  Allergic vaginitis. What are the causes? The cause of this condition depends on the type of vaginitis. It can be caused by:  Bacteria (bacterial vaginosis).  Yeast, which is a fungus (yeast  infection).  A parasite (trichomoniasis vaginitis).  A virus (viral vaginitis).  Low hormone levels (atrophic vaginitis). Low hormone levels can occur during pregnancy, breastfeeding, or after menopause.  Irritants, such as bubble baths, scented tampons, and feminine sprays (allergic vaginitis). Other factors can change the normal balance of the yeast and bacteria that live in the vagina. These include:  Antibiotic medicines.  Poor hygiene.  Diaphragms, vaginal sponges, spermicides, birth control pills, and intrauterine devices (IUD).  Sex.  Infection.  Uncontrolled diabetes.  A weakened defense (immune) system. What increases the risk? This condition is more likely to develop in women who:  Smoke.  Use vaginal douches, scented tampons, or scented sanitary pads.  Wear tight-fitting pants.  Wear thong underwear.  Use oral birth control pills or an IUD.  Have sex without a condom.  Have multiple sex partners.  Have an STD.  Frequently use the spermicide nonoxynol-9.  Eat lots of foods high in sugar.  Have uncontrolled diabetes.  Have low estrogen levels.  Have a weakened immune system from an immune disorder or medical treatment.  Are pregnant or breastfeeding. What are the signs or symptoms? Symptoms vary depending on the cause of the vaginitis. Common symptoms include:  Abnormal vaginal discharge. ? The discharge is white, gray, or yellow with bacterial vaginosis. ? The discharge is thick, white, and cheesy with a yeast infection. ? The discharge is frothy and yellow or  greenish with trichomoniasis.  A bad vaginal smell. The smell is fishy with bacterial vaginosis.  Vaginal itching, pain, or swelling.  Sex that is painful.  Pain or burning when urinating. Sometimes there are no symptoms. How is this diagnosed? This condition is diagnosed based on your symptoms and medical history. A physical exam, including a pelvic exam, will also be done. You  may also have other tests, including:  Tests to determine the pH level (acidity or alkalinity) of your vagina.  A whiff test, to assess the odor that results when a sample of your vaginal discharge is mixed with a potassium hydroxide solution.  Tests of vaginal fluid. A sample will be examined under a microscope. How is this treated? Treatment varies depending on the type of vaginitis you have. Your treatment may include:  Antibiotic creams or pills to treat bacterial vaginosis and trichomoniasis.  Antifungal medicines, such as vaginal creams or suppositories, to treat a yeast infection.  Medicine to ease discomfort if you have viral vaginitis. Your sexual partner should also be treated.  Estrogen delivered in a cream, pill, suppository, or vaginal ring to treat atrophic vaginitis. If vaginal dryness occurs, lubricants and moisturizing creams may help. You may need to avoid scented soaps, sprays, or douches.  Stopping use of a product that is causing allergic vaginitis. Then using a vaginal cream to treat the symptoms. Follow these instructions at home: Lifestyle  Keep your genital area clean and dry. Avoid soap, and only rinse the area with water.  Do not douche or use tampons until your health care provider says it is okay to do so. Use sanitary pads, if needed.  Do not have sex until your health care provider approves. When you can return to sex, practice safe sex and use condoms.  Wipe from front to back. This avoids the spread of bacteria from the rectum to the vagina. General instructions  Take over-the-counter and prescription medicines only as told by your health care provider.  If you were prescribed an antibiotic medicine, take or use it as told by your health care provider. Do not stop taking or using the antibiotic even if you start to feel better.  Keep all follow-up visits as told by your health care provider. This is important. How is this prevented?  Use mild,  non-scented products. Do not use things that can irritate the vagina, such as fabric softeners. Avoid the following products if they are scented: ? Feminine sprays. ? Detergents. ? Tampons. ? Feminine hygiene products. ? Soaps or bubble baths.  Let air reach your genital area. ? Wear cotton underwear to reduce moisture buildup. ? Avoid wearing underwear while you sleep. ? Avoid wearing tight pants and underwear or nylons without a cotton panel. ? Avoid wearing thong underwear.  Take off any wet clothing, such as bathing suits, as soon as possible.  Practice safe sex and use condoms. Contact a health care provider if:  You have abdominal pain.  You have a fever.  You have symptoms that last for more than 2-3 days. Get help right away if:  You have a fever and your symptoms suddenly get worse. Summary  Vaginitis is a condition in which the vaginal tissue becomes inflamed.This condition is most often caused by a change in the normal balance of bacteria and yeast that live in the vagina.  Treatment varies depending on the type of vaginitis you have.  Do not douche, use tampons , or have sex until your health care provider  approves. When you can return to sex, practice safe sex and use condoms. This information is not intended to replace advice given to you by your health care provider. Make sure you discuss any questions you have with your health care provider. Document Revised: 04/05/2017 Document Reviewed: 05/29/2016 Elsevier Patient Education  2020 [employer].

## 2019-06-24 NOTE — Progress Notes (Signed)
GYNECOLOGY  VISIT   HPI: 24 y.o.   Single White or Caucasian Not Hispanic or Latino  female   [id number] with No LMP recorded. (Menstrual status: IUD).   here for 3 month TOC for chlamydia. She was diagnosed on screening in 11/20. She and her partner were both treated. Used condoms for a while, not anymore.   She has a h/o BV. She c/o a 1 week h/o a significant vaginal odor, showers 3 x a day. No discharge. She had BV in 11/20.   She has a mirena IUD, placed 08/24/17.  GYNECOLOGIC HISTORY: No LMP recorded. (Menstrual status: IUD). Contraception: IUD  Menopausal hormone therapy: none        OB History    Gravida  0   Para  0   Term  0   Preterm  0   AB  0   Living  0     SAB  0   TAB  0   Ectopic  0   Multiple  0   Live Births  0              Patient Active Problem List   Diagnosis Date Noted  . Depression, major, single episode, moderate ([place]) 04/15/2018    Past Medical History:  Diagnosis Date  . Anxiety     Past Surgical History:  Procedure Laterality Date  . INTRAUTERINE DEVICE (IUD) INSERTION  09/03/2017   Mirena   . TONSILLECTOMY  04/2017    Current Outpatient Medications  Medication Sig Dispense Refill  . levonorgestrel (MIRENA) 20 MCG/24HR IUD 1 each by Intrauterine route once.    . sertraline (ZOLOFT) 25 MG tablet Take 25 mg by mouth daily.     No current facility-administered medications for this visit.     ALLERGIES: Patient has no known allergies.  Family History  Problem Relation Age of Onset  . Deep vein thrombosis Father 23    Social History   Socioeconomic History  . Marital status: Single    Spouse name: Not on file  . Number of children: Not on file  . Years of education: Not on file  . Highest education level: Not on file  Occupational History  . Not on file  Tobacco Use  . Smoking status: Never Smoker  . Smokeless tobacco: Never Used  Substance and Sexual Activity  . Alcohol use: Yes    Comment: Social   .  Drug use: No  . Sexual activity: Yes    Partners: Male    Birth control/protection: I.U.D.    Comment: Mirena 09-03-17   Other Topics Concern  . Not on file  Social History Narrative  . Not on file   Social Determinants of Health   Financial Resource Strain:   . Difficulty of Paying Living Expenses: Not on file  Food Insecurity:   . Worried About Charity fundraiser in the Last Year: Not on file  . Ran Out of Food in the Last Year: Not on file  Transportation Needs:   . Lack of Transportation (Medical): Not on file  . Lack of Transportation (Non-Medical): Not on file  Physical Activity:   . Days of Exercise per Week: Not on file  . Minutes of Exercise per Session: Not on file  Stress:   . Feeling of Stress : Not on file  Social Connections:   . Frequency of Communication with Friends and Family: Not on file  . Frequency of Social Gatherings with Friends and Family: Not  on file  . Attends Religious Services: Not on file  . Active Member of Clubs or Organizations: Not on file  . Attends Banker Meetings: Not on file  . Marital Status: Not on file  Intimate Partner Violence:   . Fear of Current or Ex-Partner: Not on file  . Emotionally Abused: Not on file  . Physically Abused: Not on file  . Sexually Abused: Not on file    Review of Systems  Constitutional: Negative.   HENT: Negative.   Eyes: Negative.   Respiratory: Negative.   Cardiovascular: Negative.   Gastrointestinal: Negative.   Genitourinary:       Vaginal discharge Vaginal odor   Musculoskeletal: Negative.   Skin: Negative.   Neurological: Negative.   Endo/Heme/Allergies: Negative.   Psychiatric/Behavioral: Negative.     PHYSICAL EXAMINATION:    BP 110/74 (BP Location: Right Arm, Patient Position: Sitting, Cuff Size: Normal)   Pulse 72   Temp 97.6 F (36.4 C) (Skin)   Resp 14   Ht 5\' 5"  (1.651 m)   Wt 182 lb 6.4 oz (82.7 kg)   BMI 30.35 kg/m     General appearance: alert,  cooperative and appears stated age  Pelvic: External genitalia:  no lesions              Urethra:  normal appearing urethra with no masses, tenderness or lesions              Bartholins and Skenes: normal                 Vagina: normal appearing vagina with an increase in frothy, watery, white vaginal discharge              Cervix: no lesions and IUD string 3-4 cm               Chaperone was present for exam.  ASSESSMENT H/O chlamydia, discussed risks of tubal damage and ectopic pregnancy. Information given BV, 2nd episode since 11/20    PLAN Condom use encouraged Nuswab for GC/CT/Trich Treat BV with metrogel If she has another episode of BV within 6 months would treat with suppression.   An After Visit Summary was printed and given to the patient.

## 2019-06-27 LAB — CHLAMYDIA/GONOCOCCUS/TRICHOMONAS, NAA
Chlamydia by NAA: NEGATIVE
Gonococcus by NAA: NEGATIVE
Trich vag by NAA: NEGATIVE

## 2019-07-08 ENCOUNTER — Emergency Department (HOSPITAL_COMMUNITY): Admitting: Anesthesiology

## 2019-07-08 ENCOUNTER — Emergency Department (HOSPITAL_COMMUNITY)

## 2019-07-08 ENCOUNTER — Observation Stay (HOSPITAL_COMMUNITY)
Admission: EM | Admit: 2019-07-08 | Discharge: 2019-07-09 | Disposition: A | Discharge status: Home / Self Care | Attending: General Surgery | Admitting: General Surgery

## 2019-07-08 ENCOUNTER — Other Ambulatory Visit: Payer: Self-pay

## 2019-07-08 ENCOUNTER — Encounter (HOSPITAL_COMMUNITY): Payer: Self-pay | Admitting: Emergency Medicine

## 2019-07-08 ENCOUNTER — Encounter (HOSPITAL_COMMUNITY)
Admission: EM | Disposition: A | Discharge status: Home / Self Care | Payer: Self-pay | Source: Home / Self Care | Attending: Emergency Medicine

## 2019-07-08 DIAGNOSIS — K3580 Unspecified acute appendicitis: Secondary | ICD-10-CM | POA: Diagnosis not present

## 2019-07-08 DIAGNOSIS — Z79899 Other long term (current) drug therapy: Secondary | ICD-10-CM | POA: Diagnosis not present

## 2019-07-08 DIAGNOSIS — F329 Major depressive disorder, single episode, unspecified: Secondary | ICD-10-CM | POA: Diagnosis not present

## 2019-07-08 DIAGNOSIS — F419 Anxiety disorder, unspecified: Secondary | ICD-10-CM | POA: Diagnosis not present

## 2019-07-08 DIAGNOSIS — Z20822 Contact with and (suspected) exposure to covid-19: Secondary | ICD-10-CM | POA: Diagnosis not present

## 2019-07-08 DIAGNOSIS — R1012 Left upper quadrant pain: Secondary | ICD-10-CM | POA: Diagnosis present

## 2019-07-08 DIAGNOSIS — Z975 Presence of (intrauterine) contraceptive device: Secondary | ICD-10-CM | POA: Diagnosis not present

## 2019-07-08 DIAGNOSIS — Z793 Long term (current) use of hormonal contraceptives: Secondary | ICD-10-CM | POA: Insufficient documentation

## 2019-07-08 DIAGNOSIS — N83201 Unspecified ovarian cyst, right side: Secondary | ICD-10-CM | POA: Insufficient documentation

## 2019-07-08 HISTORY — PX: LAPAROSCOPIC APPENDECTOMY: SHX408

## 2019-07-08 LAB — COMPREHENSIVE METABOLIC PANEL
ALT: 18 U/L (ref 0–44)
AST: 20 U/L (ref 15–41)
Albumin: 4.5 g/dL (ref 3.5–5.0)
Alkaline Phosphatase: 44 U/L (ref 38–126)
Anion gap: 11 (ref 5–15)
BUN: 11 mg/dL (ref 6–20)
CO2: 21 mmol/L — ABNORMAL LOW (ref 22–32)
Calcium: 9.4 mg/dL (ref 8.9–10.3)
Chloride: 105 mmol/L (ref 98–111)
Creatinine, Ser: 0.81 mg/dL (ref 0.44–1.00)
GFR calc Af Amer: >60 mL/min (ref >60)
GFR calc non Af Amer: >60 mL/min (ref >60)
Glucose, Bld: 103 mg/dL — ABNORMAL HIGH (ref 70–99)
Potassium: 4.1 mmol/L (ref 3.5–5.1)
Sodium: 137 mmol/L (ref 135–145)
Total Bilirubin: 0.9 mg/dL (ref 0.3–1.2)
Total Protein: 8.1 g/dL (ref 6.5–8.1)

## 2019-07-08 LAB — CBC
HCT: 39.4 % (ref 36.0–46.0)
Hemoglobin: 13.4 g/dL (ref 12.0–15.0)
MCH: 30.9 pg (ref 26.0–34.0)
MCHC: 34 g/dL (ref 30.0–36.0)
MCV: 90.8 fL (ref 80.0–100.0)
Platelets: 324 10*3/uL (ref 150–400)
RBC: 4.34 MIL/uL (ref 3.87–5.11)
RDW: 12.4 % (ref 11.5–15.5)
WBC: 12.7 10*3/uL — ABNORMAL HIGH (ref 4.0–10.5)
nRBC: 0 % (ref 0.0–0.2)

## 2019-07-08 LAB — URINALYSIS, ROUTINE W REFLEX MICROSCOPIC
Bilirubin Urine: NEGATIVE
Glucose, UA: NEGATIVE mg/dL
Hgb urine dipstick: NEGATIVE
Ketones, ur: 80 mg/dL — AB
Leukocytes,Ua: NEGATIVE
Nitrite: NEGATIVE
Protein, ur: NEGATIVE mg/dL
Specific Gravity, Urine: 1.016 (ref 1.005–1.030)
pH: 7 (ref 5.0–8.0)

## 2019-07-08 LAB — I-STAT BETA HCG BLOOD, ED (MC, WL, AP ONLY): I-stat hCG, quantitative: <5 m[IU]/mL (ref <5)

## 2019-07-08 LAB — LIPASE, BLOOD: Lipase: 20 U/L (ref 11–51)

## 2019-07-08 LAB — RESPIRATORY PANEL BY RT PCR (FLU A&B, COVID)
Influenza A by PCR: NEGATIVE
Influenza B by PCR: NEGATIVE
SARS Coronavirus 2 by RT PCR: NEGATIVE

## 2019-07-08 SURGERY — APPENDECTOMY, LAPAROSCOPIC
Anesthesia: General

## 2019-07-08 MED ORDER — SUCCINYLCHOLINE CHLORIDE 200 MG/10ML IV SOSY
PREFILLED_SYRINGE | INTRAVENOUS | Status: AC
  Filled 2019-07-08: qty 10

## 2019-07-08 MED ORDER — ENOXAPARIN SODIUM 40 MG/0.4ML ~~LOC~~ SOLN: 40.0000 mg | Freq: Every day | SUBCUTANEOUS | Status: DC

## 2019-07-08 MED ORDER — FENTANYL CITRATE (PF) 100 MCG/2ML IJ SOLN
INTRAMUSCULAR | Status: DC | PRN
  Administered 2019-07-08: 100 ug via INTRAVENOUS
  Administered 2019-07-08 (×3): 50 ug via INTRAVENOUS

## 2019-07-08 MED ORDER — ROCURONIUM BROMIDE 50 MG/5ML IV SOSY
PREFILLED_SYRINGE | INTRAVENOUS | Status: DC | PRN
  Administered 2019-07-08: 50 mg via INTRAVENOUS

## 2019-07-08 MED ORDER — MIDAZOLAM HCL 2 MG/2ML IJ SOLN
INTRAMUSCULAR | Status: AC
  Filled 2019-07-08: qty 2

## 2019-07-08 MED ORDER — POTASSIUM CHLORIDE IN NACL 20-0.9 MEQ/L-% IV SOLN
INTRAVENOUS | Status: DC
  Filled 2019-07-08: qty 1000

## 2019-07-08 MED ORDER — DIPHENHYDRAMINE HCL 25 MG PO CAPS
25.0000 mg | ORAL_CAPSULE | Freq: Four times a day (QID) | ORAL | Status: DC | PRN
  Administered 2019-07-09: 25 mg via ORAL
  Filled 2019-07-08: qty 1

## 2019-07-08 MED ORDER — DEXAMETHASONE SODIUM PHOSPHATE 10 MG/ML IJ SOLN
INTRAMUSCULAR | Status: DC | PRN
  Administered 2019-07-08: 10 mg via INTRAVENOUS

## 2019-07-08 MED ORDER — PROPOFOL 10 MG/ML IV BOLUS
INTRAVENOUS | Status: DC | PRN
  Administered 2019-07-08: 200 mg via INTRAVENOUS

## 2019-07-08 MED ORDER — HYDROMORPHONE HCL 1 MG/ML IJ SOLN: 1.0000 mg | INTRAMUSCULAR | Status: DC | PRN

## 2019-07-08 MED ORDER — LACTATED RINGERS IV SOLN
INTRAVENOUS | Status: DC | PRN
  Administered 2019-07-08: 1000 mL via INTRAVENOUS

## 2019-07-08 MED ORDER — ONDANSETRON HCL 4 MG/2ML IJ SOLN: 4.0000 mg | Freq: Four times a day (QID) | INTRAMUSCULAR | Status: DC | PRN

## 2019-07-08 MED ORDER — KETOROLAC TROMETHAMINE 30 MG/ML IJ SOLN
INTRAMUSCULAR | Status: AC
  Filled 2019-07-08: qty 1

## 2019-07-08 MED ORDER — GABAPENTIN 300 MG PO CAPS
300.0000 mg | ORAL_CAPSULE | Freq: Two times a day (BID) | ORAL | Status: DC
  Administered 2019-07-09 (×2): 300 mg via ORAL
  Filled 2019-07-08 (×2): qty 1

## 2019-07-08 MED ORDER — LACTATED RINGERS IV SOLN: INTRAVENOUS | Status: DC

## 2019-07-08 MED ORDER — DIPHENHYDRAMINE HCL 50 MG/ML IJ SOLN: 25.0000 mg | Freq: Four times a day (QID) | INTRAMUSCULAR | Status: DC | PRN

## 2019-07-08 MED ORDER — LIDOCAINE 2% (20 MG/ML) 5 ML SYRINGE
INTRAMUSCULAR | Status: AC
  Filled 2019-07-08: qty 5

## 2019-07-08 MED ORDER — KETOROLAC TROMETHAMINE 30 MG/ML IJ SOLN
30.0000 mg | Freq: Once | INTRAMUSCULAR | Status: AC | PRN
  Administered 2019-07-08: 30 mg via INTRAVENOUS

## 2019-07-08 MED ORDER — ROCURONIUM BROMIDE 10 MG/ML (PF) SYRINGE
PREFILLED_SYRINGE | INTRAVENOUS | Status: AC
  Filled 2019-07-08: qty 10

## 2019-07-08 MED ORDER — OXYCODONE HCL 5 MG/5ML PO SOLN: 5.0000 mg | Freq: Once | ORAL | Status: DC | PRN

## 2019-07-08 MED ORDER — OXYCODONE HCL 5 MG PO TABS
5.0000 mg | ORAL_TABLET | ORAL | Status: DC | PRN
  Administered 2019-07-09: 5 mg via ORAL
  Filled 2019-07-08: qty 1

## 2019-07-08 MED ORDER — ONDANSETRON 4 MG PO TBDP: 4.0000 mg | ORAL_TABLET | Freq: Four times a day (QID) | ORAL | Status: DC | PRN

## 2019-07-08 MED ORDER — SODIUM CHLORIDE 0.9% FLUSH
3.0000 mL | Freq: Once | INTRAVENOUS | Status: AC
  Administered 2019-07-08: 3 mL via INTRAVENOUS

## 2019-07-08 MED ORDER — METRONIDAZOLE IN NACL 5-0.79 MG/ML-% IV SOLN
500.0000 mg | Freq: Once | INTRAVENOUS | Status: AC
  Administered 2019-07-08: 500 mg via INTRAVENOUS
  Filled 2019-07-08: qty 100

## 2019-07-08 MED ORDER — HYDROMORPHONE HCL 1 MG/ML IJ SOLN
0.2500 mg | INTRAMUSCULAR | Status: DC | PRN
  Administered 2019-07-08 (×2): 0.5 mg via INTRAVENOUS

## 2019-07-08 MED ORDER — FENTANYL CITRATE (PF) 250 MCG/5ML IJ SOLN
INTRAMUSCULAR | Status: AC
  Filled 2019-07-08: qty 5

## 2019-07-08 MED ORDER — BUPIVACAINE HCL (PF) 0.5 % IJ SOLN
INTRAMUSCULAR | Status: DC | PRN
  Administered 2019-07-08: 20 mL

## 2019-07-08 MED ORDER — ONDANSETRON HCL 4 MG/2ML IJ SOLN
4.0000 mg | Freq: Once | INTRAMUSCULAR | Status: AC
  Administered 2019-07-08: 4 mg via INTRAVENOUS
  Filled 2019-07-08: qty 2

## 2019-07-08 MED ORDER — LACTATED RINGERS IV SOLN: INTRAVENOUS | Status: DC | PRN

## 2019-07-08 MED ORDER — OXYCODONE HCL 5 MG PO TABS: 5.0000 mg | ORAL_TABLET | Freq: Once | ORAL | Status: DC | PRN

## 2019-07-08 MED ORDER — IOHEXOL 300 MG/ML  SOLN
100.0000 mL | Freq: Once | INTRAMUSCULAR | Status: AC | PRN
  Administered 2019-07-08: 100 mL via INTRAVENOUS

## 2019-07-08 MED ORDER — DEXAMETHASONE SODIUM PHOSPHATE 10 MG/ML IJ SOLN
INTRAMUSCULAR | Status: AC
  Filled 2019-07-08: qty 1

## 2019-07-08 MED ORDER — BUPIVACAINE HCL (PF) 0.5 % IJ SOLN
INTRAMUSCULAR | Status: AC
  Filled 2019-07-08: qty 30

## 2019-07-08 MED ORDER — HYDROMORPHONE HCL 1 MG/ML IJ SOLN
INTRAMUSCULAR | Status: AC
  Filled 2019-07-08: qty 1

## 2019-07-08 MED ORDER — SODIUM CHLORIDE 0.9 % IV SOLN
2.0000 g | Freq: Once | INTRAVENOUS | Status: AC
  Administered 2019-07-08: 2 g via INTRAVENOUS
  Filled 2019-07-08: qty 20

## 2019-07-08 MED ORDER — SUGAMMADEX SODIUM 200 MG/2ML IV SOLN
INTRAVENOUS | Status: DC | PRN
  Administered 2019-07-08: 200 mg via INTRAVENOUS

## 2019-07-08 MED ORDER — ONDANSETRON HCL 4 MG/2ML IJ SOLN
INTRAMUSCULAR | Status: DC | PRN
  Administered 2019-07-08: 4 mg via INTRAVENOUS

## 2019-07-08 MED ORDER — MIDAZOLAM HCL 5 MG/5ML IJ SOLN
INTRAMUSCULAR | Status: DC | PRN
  Administered 2019-07-08 (×2): 1 mg via INTRAVENOUS

## 2019-07-08 MED ORDER — LIDOCAINE 2% (20 MG/ML) 5 ML SYRINGE
INTRAMUSCULAR | Status: DC | PRN
  Administered 2019-07-08: 100 mg via INTRAVENOUS

## 2019-07-08 MED ORDER — ONDANSETRON HCL 4 MG/2ML IJ SOLN: 4.0000 mg | Freq: Once | INTRAMUSCULAR | Status: DC | PRN

## 2019-07-08 MED ORDER — PROPOFOL 10 MG/ML IV BOLUS
INTRAVENOUS | Status: AC
  Filled 2019-07-08: qty 20

## 2019-07-08 MED ORDER — MORPHINE SULFATE (PF) 4 MG/ML IV SOLN
4.0000 mg | Freq: Once | INTRAVENOUS | Status: AC
  Administered 2019-07-08: 4 mg via INTRAVENOUS
  Filled 2019-07-08: qty 1

## 2019-07-08 MED ORDER — TRAMADOL HCL 50 MG PO TABS: 50.0000 mg | ORAL_TABLET | Freq: Four times a day (QID) | ORAL | Status: DC | PRN

## 2019-07-08 MED ORDER — CELECOXIB 200 MG PO CAPS
200.0000 mg | ORAL_CAPSULE | Freq: Two times a day (BID) | ORAL | Status: DC
  Administered 2019-07-09 (×2): 200 mg via ORAL
  Filled 2019-07-08 (×2): qty 1

## 2019-07-08 MED ORDER — ONDANSETRON HCL 4 MG/2ML IJ SOLN
INTRAMUSCULAR | Status: AC
  Filled 2019-07-08: qty 2

## 2019-07-08 SURGICAL SUPPLY — 32 items
APPLIER CLIP 5 13 M/L LIGAMAX5 (MISCELLANEOUS)
CHLORAPREP W/TINT 26 (MISCELLANEOUS) ×3 IMPLANT
CLIP APPLIE 5 13 M/L LIGAMAX5 (MISCELLANEOUS) IMPLANT
COVER SURGICAL LIGHT HANDLE (MISCELLANEOUS) ×3 IMPLANT
COVER WAND RF STERILE (DRAPES) IMPLANT
CUTTER FLEX LINEAR 45M (STAPLE) IMPLANT
DECANTER SPIKE VIAL GLASS SM (MISCELLANEOUS) ×3 IMPLANT
DERMABOND ADVANCED (GAUZE/BANDAGES/DRESSINGS) ×2
DERMABOND ADVANCED .7 DNX12 (GAUZE/BANDAGES/DRESSINGS) ×1 IMPLANT
DRAPE LAPAROSCOPIC ABDOMINAL (DRAPES) ×3 IMPLANT
ELECT COAG MONOPOLAR (ELECTROSURGICAL) ×3
ELECT REM PT RETURN 15FT ADLT (MISCELLANEOUS) ×3 IMPLANT
ELECTRODE COAG MONOPOLAR (ELECTROSURGICAL) ×1 IMPLANT
GLOVE SURG SIGNA 7.5 PF LTX (GLOVE) ×6 IMPLANT
GOWN STRL REUS W/TWL XL LVL3 (GOWN DISPOSABLE) ×6 IMPLANT
KIT BASIN OR (CUSTOM PROCEDURE TRAY) ×3 IMPLANT
KIT TURNOVER KIT A (KITS) IMPLANT
PENCIL SMOKE EVACUATOR (MISCELLANEOUS) IMPLANT
POUCH SPECIMEN RETRIEVAL 10MM (ENDOMECHANICALS) ×3 IMPLANT
RELOAD 45 VASCULAR/THIN (ENDOMECHANICALS) IMPLANT
RELOAD STAPLE TA45 3.5 REG BLU (ENDOMECHANICALS) IMPLANT
SET IRRIG TUBING LAPAROSCOPIC (IRRIGATION / IRRIGATOR) ×3 IMPLANT
SET TUBE SMOKE EVAC HIGH FLOW (TUBING) ×3 IMPLANT
SHEARS HARMONIC ACE PLUS 36CM (ENDOMECHANICALS) IMPLANT
SLEEVE XCEL OPT CAN 5 100 (ENDOMECHANICALS) ×3 IMPLANT
SUT MNCRL AB 4-0 PS2 18 (SUTURE) ×3 IMPLANT
TOWEL OR 17X26 10 PK STRL BLUE (TOWEL DISPOSABLE) ×3 IMPLANT
TOWEL OR NON WOVEN STRL DISP B (DISPOSABLE) ×3 IMPLANT
TRAY FOLEY MTR SLVR 16FR STAT (SET/KITS/TRAYS/PACK) ×3 IMPLANT
TRAY LAPAROSCOPIC (CUSTOM PROCEDURE TRAY) ×3 IMPLANT
TROCAR BLADELESS OPT 5 100 (ENDOMECHANICALS) ×3 IMPLANT
TROCAR XCEL BLUNT TIP 100MML (ENDOMECHANICALS) ×3 IMPLANT

## 2019-07-08 NOTE — Anesthesia Preprocedure Evaluation (Addendum)
Anesthesia Evaluation  Patient identified by MRN, date of birth, ID band Patient awake    Reviewed: Allergy & Precautions, NPO status , Patient's Chart, lab work & pertinent test results  Airway Mallampati: I  TM Distance: >3 FB Neck ROM: Full    Dental no notable dental hx. (+) Teeth Intact, Dental Advisory Given   Pulmonary neg pulmonary ROS,    Pulmonary exam normal breath sounds clear to auscultation       Cardiovascular Exercise Tolerance: Good negative cardio ROS Normal cardiovascular exam Rhythm:Regular Rate:Normal     Neuro/Psych Anxiety negative neurological ROS     GI/Hepatic negative GI ROS, Neg liver ROS,   Endo/Other  negative endocrine ROS  Renal/GU K+4.1     Musculoskeletal negative musculoskeletal ROS (+)   Abdominal   Peds  Hematology Wbc 12.7 Hgb 13.4   Anesthesia Other Findings   Reproductive/Obstetrics negative OB ROS                            Anesthesia Physical Anesthesia Plan  ASA: I and emergent  Anesthesia Plan: General   Post-op Pain Management:    Induction: Intravenous  PONV Risk Score and Plan: 4 or greater and Ondansetron, Dexamethasone, Treatment may vary due to age or medical condition and Midazolam  Airway Management Planned: Oral ETT  Additional Equipment: None  Intra-op Plan:   Post-operative Plan: Extubation in OR  Informed Consent: I have reviewed the patients History and Physical, chart, labs and discussed the procedure including the risks, benefits and alternatives for the proposed anesthesia with the patient or authorized representative who has indicated his/her understanding and acceptance.     Dental advisory given  Plan Discussed with: CRNA  Anesthesia Plan Comments:         Anesthesia Quick Evaluation

## 2019-07-08 NOTE — Transfer of Care (Signed)
Immediate Anesthesia Transfer of Care Note  Patient: Jacqueline Giles  Procedure(s) Performed: APPENDECTOMY LAPAROSCOPIC (N/A )  Patient Location: PACU  Anesthesia Type:General  Level of Consciousness: awake and patient cooperative  Airway & Oxygen Therapy: Patient Spontanous Breathing and Patient connected to face mask oxygen  Post-op Assessment: Report given to RN and Post -op Vital signs reviewed and stable  Post vital signs: Reviewed and stable  Last Vitals:  Vitals Value Taken Time  BP 134/73 07/08/19 2311  Temp 36.9 C 07/08/19 2311  Pulse 117 07/08/19 2313  Resp 24 07/08/19 2313  SpO2 100 % 07/08/19 2313  Vitals shown include unvalidated device data.  Last Pain:  Vitals:   07/08/19 1422  TempSrc:   PainSc: 8          Complications: No apparent anesthesia complications

## 2019-07-08 NOTE — Op Note (Signed)
Appendectomy, Lap, Procedure Note  Indications: The patient presented with a history of right-sided abdominal pain. A CT revealed findings consistent with acute appendicitis.  Pre-operative Diagnosis: acute appendicitis  Post-operative Diagnosis: Same  Surgeon: Abigail Miyamoto   Assistants: 0  Anesthesia: General endotracheal anesthesia  ASA Class: 1  Procedure Details  The patient was seen again in the Holding Room. The risks, benefits, complications, treatment options, and expected outcomes were discussed with the patient and/or family. The possibilities of reaction to medication, perforation of viscus, bleeding, recurrent infection, finding a normal appendix, the need for additional procedures, failure to diagnose a condition, and creating a complication requiring transfusion or operation were discussed. There was concurrence with the proposed plan and informed consent was obtained. The site of surgery was properly noted. The patient was taken to Operating Room, identified as Jacqueline Giles and the procedure verified as Appendectomy. A Time Out was held and the above information confirmed.  The patient was placed in the supine position and general anesthesia was induced, along with placement of orogastric tube, Venodyne boots, and a Foley catheter. The abdomen was prepped and draped in a sterile fashion. A one centimeter infraumbilical incision was made.  The e fascia was incised with a #15 blade.  A Kelly clamp was used to confirm entrance into the peritoneal cavity.  A pursestring suture was passed around the incision with a 0 Vicryl.  The Hasson was introduced into the abdomen and the tails of the suture were used to hold the Hasson in place.   The pneumoperitoneum was then established to steady pressure of 15 mmHg.  Additional 5 mm cannulas then placed in the left lower quadrant of the abdomen and the right upper quadrant under direct visualization. A careful evaluation of the entire  abdomen was carried out. The patient was placed in Trendelenburg and left lateral decubitus position. The small intestines were retracted in the cephalad and left lateral direction away from the pelvis and right lower quadrant. The patient was found to have an enlarged and inflamed appendix that was extending into the pelvis. There was no evidence of perforation.  The appendix was carefully dissected. The appendix was was skeletonized with the harmonic scalpel.   The appendix was divided at its base using an endo-GIA stapler. Minimal appendiceal stump was left in place. There was no evidence of bleeding, leakage, or complication after division of the appendix. Irrigation was also performed and irrigate suctioned from the abdomen as well.  The umbilical port site was closed with the purse string suture. There was no residual palpable fascial defect.  The trocar site skin wounds were closed with 4-0 Monocryl.  Instrument, sponge, and needle counts were correct at the conclusion of the case.   Findings: The appendix was found to be inflamed. There were not signs of necrosis.  There was not perforation. There was not abscess formation.  Estimated Blood Loss:  Minimal          Complications:  None; patient tolerated the procedure well.         Disposition: PACU - hemodynamically stable.         Condition: stable

## 2019-07-08 NOTE — H&P (Signed)
Jacqueline Giles is an 24 y.o. female.   Chief Complaint: abdominal pain HPI: This is a 24 year old female who presents with abdominal pain.  She reports the pain started this morning in the upper abdomen and epigastric area.  She did develop emesis.  She has no fevers or chills.  The pain is since moved down partly to the right lower quadrant.  She described the pain as sharp and moderate to severe.  She has no previous similar history of abdominal pain.  She has no upper respiratory tract symptoms.  She denies dysuria.  She is otherwise healthy without complaints.  Past Medical History:  Diagnosis Date  . Anxiety   . History of chlamydia     Past Surgical History:  Procedure Laterality Date  . INTRAUTERINE DEVICE (IUD) INSERTION  09/03/2017   Mirena   . TONSILLECTOMY  04/2017    Family History  Problem Relation Age of Onset  . Deep vein thrombosis Father 71   Social History:  reports that she has never smoked. She has never used smokeless tobacco. She reports current alcohol use. She reports that she does not use drugs.  Allergies: No Known Allergies  (Not in a hospital admission)   Results for orders placed or performed during the hospital encounter of 07/08/19 (from the past 48 hour(s))  Lipase, blood     Status: None   Collection Time: 07/08/19  2:37 PM  Result Value Ref Range   Lipase 20 11 - 51 U/L    Comment: Performed at [facility], [place] [address], [place], [place] [address]  Comprehensive metabolic panel     Status: Abnormal   Collection Time: 07/08/19  2:37 PM  Result Value Ref Range   Sodium 137 135 - 145 mmol/L   Potassium 4.1 3.5 - 5.1 mmol/L   Chloride 105 98 - 111 mmol/L   CO2 21 (L) 22 - 32 mmol/L   Glucose, Bld 103 (H) 70 - 99 mg/dL    Comment: Glucose reference range applies only to samples taken after fasting for at least 8 hours.   BUN 11 6 - 20 mg/dL   Creatinine, Ser 0.81 0.44 - 1.00 mg/dL   Calcium 9.4 8.9 - 10.3 mg/dL   Total  Protein 8.1 6.5 - 8.1 g/dL   Albumin 4.5 3.5 - 5.0 g/dL   AST 20 15 - 41 U/L   ALT 18 0 - 44 U/L   Alkaline Phosphatase 44 38 - 126 U/L   Total Bilirubin 0.9 0.3 - 1.2 mg/dL   GFR calc non Af Amer >60 >60 mL/min   GFR calc Af Amer >60 >60 mL/min   Anion gap 11 5 - 15    Comment: Performed at [facility], [place] [address] [place], [place] [address]  CBC     Status: Abnormal   Collection Time: 07/08/19  2:37 PM  Result Value Ref Range   WBC 12.7 (H) 4.0 - 10.5 K/uL   RBC 4.34 3.87 - 5.11 MIL/uL   Hemoglobin 13.4 12.0 - 15.0 g/dL   HCT 39.4 36.0 - 46.0 %   MCV 90.8 80.0 - 100.0 fL   MCH 30.9 26.0 - 34.0 pg   MCHC 34.0 30.0 - 36.0 g/dL   RDW 12.4 11.5 - 15.5 %   Platelets 324 150 - 400 K/uL   nRBC 0.0 0.0 - 0.2 %    Comment: Performed at [facility], [place] [address], [place], [place] [address]  I-Stat  beta hCG blood, ED     Status: None   Collection Time: 07/08/19  2:41 PM  Result Value Ref Range   I-stat hCG, quantitative <5.0 <5 mIU/mL   Comment 3            Comment:   GEST. AGE      CONC.  (mIU/mL)   <=1 WEEK        5 - 50     2 WEEKS       50 - 500     3 WEEKS       100 - 10,000     4 WEEKS     1,000 - 30,000        FEMALE AND NON-PREGNANT FEMALE:     LESS THAN 5 mIU/mL   Urinalysis, Routine w reflex microscopic     Status: Abnormal   Collection Time: 07/08/19  5:30 PM  Result Value Ref Range   Color, Urine YELLOW YELLOW   APPearance CLEAR CLEAR   Specific Gravity, Urine 1.016 1.005 - 1.030   pH 7.0 5.0 - 8.0   Glucose, UA NEGATIVE NEGATIVE mg/dL   Hgb urine dipstick NEGATIVE NEGATIVE   Bilirubin Urine NEGATIVE NEGATIVE   Ketones, ur 80 (A) NEGATIVE mg/dL   Protein, ur NEGATIVE NEGATIVE mg/dL   Nitrite NEGATIVE NEGATIVE   Leukocytes,Ua NEGATIVE NEGATIVE    Comment: Performed at [facility], [address] [place], [place] [address]   CT Abdomen Pelvis W Contrast  Result Date: 07/08/2019 CLINICAL DATA:   Severe abdominal pain with nausea and vomiting this morning. Abdominal distension. EXAM: CT ABDOMEN AND PELVIS WITH CONTRAST TECHNIQUE: Multidetector CT imaging of the abdomen and pelvis was performed using the standard protocol following bolus administration of intravenous contrast. CONTRAST:  OMNIPAQUE IOHEXOL 300 MG/ML  SOLN COMPARISON:  None. FINDINGS: Lower chest: Normal. Hepatobiliary: Normal. Pancreas: Normal. Spleen: Normal. Adrenals/Urinary Tract: Normal. Stomach/Bowel: Abnormal 10 x 11 mm diameter of a fluid-filled appendix containing punctate appendicoliths in the right hemipelvis medial to the iliac vessels, axial image 60 /coronal image 55/sagittal image 71. No perforation or pericolonic abscess, free fluid or significant periappendiceal mesentery inflammation. Moderate colonic stool burden without bowel obstruction. Mild diverticulosis coli. No apparent gastric abnormality. 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 No aorta aneurysm or atherosclerosis. Patent portal and hepatic veins. No adenopathy. Reproductive: Normally sized uterus. An intrauterine device in the endometrial canal at the fundal level. A crenulated 1.8 cm right ovarian benign functional cyst. Other: No abdominal wall hernia. Musculoskeletal: Small lower thoracic Schmorl's nodes. PRA critical note in progress. IMPRESSION: Abnormally dilated fluid-filled appendix containing punctate appendicoliths without periappendiceal mesenteric inflammation, free fluid, perforation or abscess. Findings would support a clinical diagnosis of an early uncomplicated acute appendicitis. A benign functional right ovarian small crenulated cyst. Electronically Signed   By: Laurence Ferrari   On: 07/08/2019 18:37    Review of Systems  Constitutional: Negative for chills and fever.  Respiratory: Negative for cough and shortness of breath.   Cardiovascular: Negative for chest  pain.  Gastrointestinal: Positive for abdominal pain, nausea and vomiting. Negative for diarrhea.  Genitourinary: Negative for dysuria.  All other systems reviewed and are negative.   Blood pressure 112/62, pulse 88, temperature 97.7 F (36.5 C), temperature source Oral, resp. rate 16, height 5\' 5"  (1.651 m), weight 74.8 kg, SpO2 100 %. Physical Exam  Constitutional: She is oriented to person, place, and  time. She appears well-developed and well-nourished. No distress.  Eyes: Pupils are equal, round, and reactive to light. Conjunctivae are normal. Right eye exhibits no discharge. Left eye exhibits no discharge. No scleral icterus.  Neck: No tracheal deviation present. No thyromegaly present.  Cardiovascular: Normal rate, regular rhythm, normal heart sounds and intact distal pulses.  No murmur heard. Respiratory: Effort normal and breath sounds normal. No respiratory distress. She has no wheezes. She exhibits no tenderness.  GI: Soft. Bowel sounds are normal. There is abdominal tenderness.  She is tender in the mid abdomen and right lower quadrant.  There is minimal guarding.  There is no distention and no frank peritonitis. There is no hepatosplenomegaly. There are no hernias  Musculoskeletal:        General: No tenderness, deformity or edema. Normal range of motion.     Cervical back: Normal range of motion and neck supple.  Lymphadenopathy:    She has no cervical adenopathy.    She has no axillary adenopathy.  Neurological: She is alert and oriented to person, place, and time.  Skin: Skin is warm and dry. No rash noted. She is not diaphoretic. No erythema.  Psychiatric: She has a normal mood and affect. Her behavior is normal. Judgment normal.     Assessment/Plan Acute appendicitis  I have reviewed the patient's CT scan and laboratory data.  She has a dilated appendix which is fluid-filled and has multiple appendicoliths.  Her exam is consistent with acute appendicitis.  I  explained the diagnosis to her.  Laparoscopic appendectomy is recommended.  I discussed the reasons for this with her.  I discussed surgical procedure in detail.  I discussed the risk which includes but is not limited to bleeding, infection, injury to surrounding structures, the need to convert to an open procedure, appendiceal stump leak, cardiopulmonary issues, DVT, the need for further procedures, etc.  She understands and wishes to proceed with surgery.  Her Covid test is currently pending.  Abigail Miyamoto, MD 07/08/2019, 8:36 PM

## 2019-07-08 NOTE — ED Triage Notes (Signed)
Pt complaint of severe abdominal pain with n/v onset this morning.

## 2019-07-08 NOTE — Anesthesia Procedure Notes (Signed)
Procedure Name: Intubation Date/Time: 07/08/2019 10:34 PM Performed by: Montel Clock, CRNA Pre-anesthesia Checklist: Patient identified, Emergency Drugs available, Suction available, Patient being monitored and Timeout performed Patient Re-evaluated:Patient Re-evaluated prior to induction Oxygen Delivery Method: Circle system utilized Preoxygenation: Pre-oxygenation with 100% oxygen Induction Type: IV induction, Rapid sequence and Cricoid Pressure applied Laryngoscope Size: Mac and 3 Grade View: Grade I Tube type: Oral Tube size: 7.0 mm Number of attempts: 1 Airway Equipment and Method: Stylet Placement Confirmation: ETT inserted through vocal cords under direct vision,  positive ETCO2 and breath sounds checked- equal and bilateral Secured at: 21 cm Tube secured with: Tape Dental Injury: Teeth and Oropharynx as per pre-operative assessment

## 2019-07-08 NOTE — ED Provider Notes (Signed)
[facility]-EMERGENCY DEPT Provider Note   CSN: [id number] Arrival date & time: 07/08/19  1407     History Chief Complaint  Patient presents with  . Abdominal Pain    Jacqueline Giles is a 24 y.o. female.  24 year old female presents with left upper quadrant and epigastric abdominal pain which began this morning.  She has had nonbilious emesis without fever or chills.  No diarrhea noted.  Denies any urinary symptoms.  Pain is been persistent and severe.  No prior history of same.  No treatment use prior to arrival.        Past Medical History:  Diagnosis Date  . Anxiety   . History of chlamydia     Patient Active Problem List   Diagnosis Date Noted  . History of chlamydia   . Depression, major, single episode, moderate (HCC) 04/15/2018    Past Surgical History:  Procedure Laterality Date  . INTRAUTERINE DEVICE (IUD) INSERTION  09/03/2017   Mirena   . TONSILLECTOMY  04/2017     OB History    Gravida  0   Para  0   Term  0   Preterm  0   AB  0   Living  0     SAB  0   TAB  0   Ectopic  0   Multiple  0   Live Births  0           Family History  Problem Relation Age of Onset  . Deep vein thrombosis Father 37    Social History   Tobacco Use  . Smoking status: Never Smoker  . Smokeless tobacco: Never Used  Substance Use Topics  . Alcohol use: Yes    Comment: Social   . Drug use: No    Home Medications Prior to Admission medications   Medication Sig Start Date End Date Taking? Authorizing Provider  levonorgestrel (MIRENA) 20 MCG/24HR IUD 1 each by Intrauterine route once.   Yes [provider]  Multiple Vitamin (MULTIVITAMIN ADULT) TABS Take 1 tablet by mouth daily.   Yes [provider]  sertraline (ZOLOFT) 25 MG tablet Take 25 mg by mouth daily.   Yes [provider]  metroNIDAZOLE (METROGEL) 0.75 % vaginal gel Place 1 Applicatorful vaginally at bedtime. Use for 5 nights. Patient not  taking: Reported on 07/08/2019 06/24/19   Romualdo Bolk, MD    Allergies    Patient has no known allergies.  Review of Systems   Review of Systems  All other systems reviewed and are negative.   Physical Exam Updated Vital Signs BP 131/77   Pulse 77   Temp 97.7 F (36.5 C) (Oral)   Resp 16   Ht 1.651 m (5\' 5" )   Wt 74.8 kg   SpO2 100%   BMI 27.46 kg/m   Physical Exam Vitals and nursing note reviewed.  Constitutional:      General: She is not in acute distress.    Appearance: Normal appearance. She is well-developed. She is not toxic-appearing.  HENT:     Head: Normocephalic and atraumatic.  Eyes:     General: Lids are normal.     Conjunctiva/sclera: Conjunctivae normal.     Pupils: Pupils are equal, round, and reactive to light.  Neck:     Thyroid: No thyroid mass.     Trachea: No tracheal deviation.  Cardiovascular:     Rate and Rhythm: Normal rate and regular rhythm.     Heart sounds:  Normal heart sounds. No murmur. No gallop.   Pulmonary:     Effort: Pulmonary effort is normal. No respiratory distress.     Breath sounds: Normal breath sounds. No stridor. No decreased breath sounds, wheezing, rhonchi or rales.  Abdominal:     General: Bowel sounds are normal. There is no distension.     Palpations: Abdomen is soft.     Tenderness: There is abdominal tenderness in the epigastric area and left upper quadrant. There is no guarding or rebound.    Musculoskeletal:        General: No tenderness. Normal range of motion.     Cervical back: Normal range of motion and neck supple.  Skin:    General: Skin is warm and dry.     Findings: No abrasion or rash.  Neurological:     Mental Status: She is alert and oriented to person, place, and time.     GCS: GCS eye subscore is 4. GCS verbal subscore is 5. GCS motor subscore is 6.     Cranial Nerves: No cranial nerve deficit.     Sensory: No sensory deficit.  Psychiatric:        Speech: Speech normal.         Behavior: Behavior normal.     ED Results / Procedures / Treatments   Labs (all labs ordered are listed, but only abnormal results are displayed) Labs Reviewed  COMPREHENSIVE METABOLIC PANEL - Abnormal; Notable for the following components:      Result Value   CO2 21 (*)    Glucose, Bld 103 (*)    All other components within normal limits  CBC - Abnormal; Notable for the following components:   WBC 12.7 (*)    All other components within normal limits  LIPASE, BLOOD  URINALYSIS, ROUTINE W REFLEX MICROSCOPIC  I-STAT BETA HCG BLOOD, ED ([facility], [facility], [facility] ONLY)    EKG None  Radiology No results found.  Procedures Procedures (including critical care time)  Medications Ordered in ED Medications  sodium chloride flush (NS) 0.9 % injection 3 mL (has no administration in time range)  ondansetron (ZOFRAN) injection 4 mg (has no administration in time range)    ED Course  I have reviewed the triage vital signs and the nursing notes.  Pertinent labs & imaging results that were available during my care of the patient were reviewed by me and considered in my medical decision making (see chart for details).  Patient with leukocytosis of 16,000.  CT of abdomen and pelvis concerning for early appendicitis.  Patient is tender in her right lower quadrant.  Will start on antibiotics and consult surgery for admission   MDM Rules/Calculators/A&P                      Final Clinical Impression(s) / ED Diagnoses Final diagnoses:  None    Rx / DC Orders ED Discharge Orders    None       Lacretia Leigh, MD 07/08/19 1936

## 2019-07-09 ENCOUNTER — Encounter: Payer: Self-pay | Admitting: *Deleted

## 2019-07-09 MED ORDER — FLUCONAZOLE 100 MG PO TABS: 100.0000 mg | ORAL_TABLET | Freq: Every day | ORAL | 0 refills | Status: AC

## 2019-07-09 MED ORDER — OXYCODONE HCL 5 MG PO TABS: 5.0000 mg | ORAL_TABLET | ORAL | 0 refills | Status: DC | PRN

## 2019-07-09 NOTE — Plan of Care (Signed)
All discharge instructions were given to Pt. All questions were answered. 

## 2019-07-09 NOTE — Anesthesia Postprocedure Evaluation (Signed)
Anesthesia Post Note  Patient: Jacqueline Giles  Procedure(s) Performed: APPENDECTOMY LAPAROSCOPIC (N/A )     Patient location during evaluation: PACU Anesthesia Type: General Level of consciousness: awake and alert Pain management: pain level controlled Vital Signs Assessment: post-procedure vital signs reviewed and stable Respiratory status: spontaneous breathing, nonlabored ventilation, respiratory function stable and patient connected to nasal cannula oxygen Cardiovascular status: blood pressure returned to baseline and stable Postop Assessment: no apparent nausea or vomiting Anesthetic complications: no    Last Vitals:  Vitals:   07/09/19 0626 07/09/19 1015  BP: (!) 127/51 126/80  Pulse: 80 80  Resp: 12 14  Temp: 36.7 C 36.6 C  SpO2: 100% 100%    Last Pain:  Vitals:   07/09/19 1015  TempSrc: Oral  PainSc:                  Trevor Iha

## 2019-07-09 NOTE — Discharge Instructions (Signed)
[facility]  Please arrive at least 30 min before your appointment to complete your check in paperwork.  If you are unable to arrive 30 min prior to your appointment time we may have to cancel or reschedule you. LAPAROSCOPIC SURGERY: POST OP INSTRUCTIONS Always review your discharge instruction sheet given to you by the facility where your surgery was performed. IF YOU HAVE DISABILITY OR FAMILY LEAVE FORMS, YOU MUST BRING THEM TO THE OFFICE FOR PROCESSING.   DO NOT GIVE THEM TO YOUR DOCTOR.  PAIN CONTROL  1. First take acetaminophen (Tylenol) AND/or ibuprofen (Advil) to control your pain after surgery.  Follow directions on package.  Taking acetaminophen (Tylenol) and/or ibuprofen (Advil) regularly after surgery will help to control your pain and lower the amount of prescription pain medication you may need.  You should not take more than 4,000 mg (4 grams) of acetaminophen (Tylenol) in 24 hours.  You should not take ibuprofen (Advil), aleve, motrin, naprosyn or other NSAIDS if you have a history of stomach ulcers or chronic kidney disease.  2. A prescription for pain medication may be given to you upon discharge.  Take your pain medication as prescribed, if you still have uncontrolled pain after taking acetaminophen (Tylenol) or ibuprofen (Advil). 3. Use ice packs to help control pain. 4. If you need a refill on your pain medication, please contact your pharmacy.  They will contact our office to request authorization. Prescriptions will not be filled after 5pm or on week-ends.  HOME MEDICATIONS 5. Take your usually prescribed medications unless otherwise directed.  DIET 6. You should follow a light diet the first few days after arrival home.  Be sure to include lots of fluids daily. Avoid fatty, fried foods.   CONSTIPATION 7. It is common to experience some constipation after surgery and if you are taking pain medication.  Increasing fluid intake and taking a stool  softener (such as Colace) will usually help or prevent this problem from occurring.  A mild laxative (Milk of Magnesia or Miralax) should be taken according to package instructions if there are no bowel movements after 48 hours.  WOUND/INCISION CARE 8. Most patients will experience some swelling and bruising in the area of the incisions.  Ice packs will help.  Swelling and bruising can take several days to resolve.  9. Unless discharge instructions indicate otherwise, follow guidelines below  a. STERI-STRIPS - you may remove your outer bandages 48 hours after surgery, and you may shower at that time.  You have steri-strips (small skin tapes) in place directly over the incision.  These strips should be left on the skin for 7-10 days.   b. DERMABOND/SKIN GLUE - you may shower in 24 hours.  The glue will flake off over the next 2-3 weeks. 10. Any sutures or staples will be removed at the office during your follow-up visit.  ACTIVITIES 11. You may resume regular (light) daily activities beginning the next day--such as daily self-care, walking, climbing stairs--gradually increasing activities as tolerated.  You may have sexual intercourse when it is comfortable.  Refrain from any heavy lifting or straining until approved by your doctor. a. You may drive when you are no longer taking prescription pain medication, you can comfortably wear a seatbelt, and you can safely maneuver your car and apply brakes.  FOLLOW-UP 12. You should see your doctor in the office for a follow-up appointment approximately 2-3 weeks after your surgery.  You should have been given your post-op/follow-up appointment when   your surgery was scheduled.  If you did not receive a post-op/follow-up appointment, make sure that you call for this appointment within a day or two after you arrive home to insure a convenient appointment time.   WHEN TO CALL YOUR DOCTOR: 1. Fever over 101.0 2. Inability to urinate 3. Continued bleeding from  incision. 4. Increased pain, redness, or drainage from the incision. 5. Increasing abdominal pain  The clinic staff is available to answer your questions during regular business hours.  Please don't hesitate to call and ask to speak to one of the nurses for clinical concerns.  If you have a medical emergency, go to the nearest emergency room or call 911.  A surgeon from [facility] is always on call at the hospital. [address], [address], [place], [place]  [address] ? [address] [address], [place], [place]   [address] [phone] ? [phone] ? FAX [phone]  .........   Managing Your Pain After Surgery Without Opioids    Thank you for participating in our program to help patients manage their pain after surgery without opioids. This is part of our effort to provide you with the best care possible, without exposing you or your family to the risk that opioids pose.  What pain can I expect after surgery? You can expect to have some pain after surgery. This is normal. The pain is typically worse the day after surgery, and quickly begins to get better. Many studies have found that many patients are able to manage their pain after surgery with Over-the-Counter (OTC) medications such as Tylenol and Motrin. If you have a condition that does not allow you to take Tylenol or Motrin, notify your surgical team.  How will I manage my pain? The best strategy for controlling your pain after surgery is around the clock pain control with Tylenol (acetaminophen) and Motrin (ibuprofen or Advil). Alternating these medications with each other allows you to maximize your pain control. In addition to Tylenol and Motrin, you can use heating pads or ice packs on your incisions to help reduce your pain.  How will I alternate your regular strength over-the-counter pain medication? You will take a dose of pain medication every three hours. ; Start by taking 650 mg of Tylenol (2 pills of 325  mg) ; 3 hours later take 600 mg of Motrin (3 pills of 200 mg) ; 3 hours after taking the Motrin take 650 mg of Tylenol ; 3 hours after that take 600 mg of Motrin.   - 1 -  See example - if your first dose of Tylenol is at 12:00 PM   12:00 PM Tylenol 650 mg (2 pills of 325 mg)  3:00 PM Motrin 600 mg (3 pills of 200 mg)  6:00 PM Tylenol 650 mg (2 pills of 325 mg)  9:00 PM Motrin 600 mg (3 pills of 200 mg)  Continue alternating every 3 hours   We recommend that you follow this schedule around-the-clock for at least 3 days after surgery, or until you feel that it is no longer needed. Use the table on the last page of this handout to keep track of the medications you are taking. Important: Do not take more than 3000mg of Tylenol or 3200mg of Motrin in a 24-hour period. Do not take ibuprofen/Motrin if you have a history of bleeding stomach ulcers, severe kidney disease, &/or actively taking a blood thinner  What if I still have pain? If you have pain that is not   controlled with the over-the-counter pain medications (Tylenol and Motrin or Advil) you might have what we call "breakthrough" pain. You will receive a prescription for a small amount of an opioid pain medication such as Oxycodone, Tramadol, or Tylenol with Codeine. Use these opioid pills in the first 24 hours after surgery if you have breakthrough pain. Do not take more than 1 pill every 4-6 hours.  If you still have uncontrolled pain after using all opioid pills, don't hesitate to call our staff using the number provided. We will help make sure you are managing your pain in the best way possible, and if necessary, we can provide a prescription for additional pain medication.   Day 1    Time  Name of Medication Number of pills taken  Amount of Acetaminophen  Pain Level   Comments  AM PM       AM PM       AM PM       AM PM       AM PM       AM PM       AM PM       AM PM       Total Daily amount of Acetaminophen Do not  take more than  3,000 mg per day      Day 2    Time  Name of Medication Number of pills taken  Amount of Acetaminophen  Pain Level   Comments  AM PM       AM PM       AM PM       AM PM       AM PM       AM PM       AM PM       AM PM       Total Daily amount of Acetaminophen Do not take more than  3,000 mg per day      Day 3    Time  Name of Medication Number of pills taken  Amount of Acetaminophen  Pain Level   Comments  AM PM       AM PM       AM PM       AM PM          AM PM       AM PM       AM PM       AM PM       Total Daily amount of Acetaminophen Do not take more than  3,000 mg per day      Day 4    Time  Name of Medication Number of pills taken  Amount of Acetaminophen  Pain Level   Comments  AM PM       AM PM       AM PM       AM PM       AM PM       AM PM       AM PM       AM PM       Total Daily amount of Acetaminophen Do not take more than  3,000 mg per day      Day 5    Time  Name of Medication Number of pills taken  Amount of Acetaminophen  Pain Level   Comments  AM PM       AM PM       AM   PM       AM PM       AM PM       AM PM       AM PM       AM PM       Total Daily amount of Acetaminophen Do not take more than  3,000 mg per day       Day 6    Time  Name of Medication Number of pills taken  Amount of Acetaminophen  Pain Level  Comments  AM PM       AM PM       AM PM       AM PM       AM PM       AM PM       AM PM       AM PM       Total Daily amount of Acetaminophen Do not take more than  3,000 mg per day      Day 7    Time  Name of Medication Number of pills taken  Amount of Acetaminophen  Pain Level   Comments  AM PM       AM PM       AM PM       AM PM       AM PM       AM PM       AM PM       AM PM       Total Daily amount of Acetaminophen Do not take more than  3,000 mg per day        For additional information about how and where to safely dispose of unused  opioid medications - https://www.morepowerfulnc.org  Disclaimer: This document contains information and/or instructional materials adapted from [facility] for the typical patient with your condition. It does not replace medical advice from your health care provider because your experience may differ from that of the typical patient. Talk to your health care provider if you have any questions about this document, your condition or your treatment plan. Adapted from [facility]   

## 2019-07-09 NOTE — Discharge Summary (Addendum)
    Patient ID: Jacqueline Giles [id number] 1995-06-17 24 y.o.  Admit date: 07/08/2019 Discharge date: 07/09/2019  Admitting Diagnosis: Acute appendicitis  Discharge Diagnosis Patient Active Problem List   Diagnosis Date Noted  . Acute appendicitis 07/08/2019  . History of chlamydia   . Depression, major, single episode, moderate (HCC) 04/15/2018    Consultants none  Reason for Admission: This is a 24 year old female who presents with abdominal pain.  She reports the pain started this morning in the upper abdomen and epigastric area.  She did develop emesis.  She has no fevers or chills.  The pain is since moved down partly to the right lower quadrant.  She described the pain as sharp and moderate to severe.  She has no previous similar history of abdominal pain.  She has no upper respiratory tract symptoms.  She denies dysuria.  She is otherwise healthy without complaints.  Procedures Lap appy, Dr. Magnus Ivan [facility] Course:  The patient was admitted and underwent a laparoscopic appendectomy.  The patient tolerated the procedure well.  On POD 1, the patient was tolerating a regular diet, voiding well, mobilizing, and pain was controlled with oral pain medications.  The patient was stable for DC home at this time with appropriate follow up made.  Physical Exam: Abd: soft, appropriately tender, +BS, incisions c/d/i with dermanbond, ND  Allergies as of 07/09/2019   No Known Allergies     Medication List    STOP taking these medications   metroNIDAZOLE 0.75 % vaginal gel Commonly known as: METROGEL     TAKE these medications   fluconazole 100 MG tablet Commonly known as: Diflucan Take 1 tablet (100 mg total) by mouth daily for 1 day.   levonorgestrel 20 MCG/24HR IUD Commonly known as: MIRENA 1 each by Intrauterine route once.   Multivitamin Adult Tabs Take 1 tablet by mouth daily.   oxyCODONE 5 MG immediate release tablet Commonly known as: Oxy IR/ROXICODONE Take 1  tablet (5 mg total) by mouth every 4 (four) hours as needed for moderate pain.   sertraline 25 MG tablet Commonly known as: ZOLOFT Take 25 mg by mouth daily.        Follow-up Information    [facility] up on 07/30/2019.   Specialty: General Surgery Why: 1:30pm, arrive by 1:00pm for paperwork and check in process.  bring insurance card and photo ID Contact information: [address] [place] [address] [phone]           Signed: Barnetta Chapel, [facility] 07/09/2019, 8:54 AM Please see Amion for pager number during day hours 7:00am-4:30pm, 7-11:30am on Weekends

## 2019-07-10 LAB — SURGICAL PATHOLOGY

## 2019-09-05 ENCOUNTER — Other Ambulatory Visit: Payer: Self-pay | Admitting: Family Medicine

## 2019-09-07 NOTE — Telephone Encounter (Signed)
Unable to lvm

## 2019-09-07 NOTE — Telephone Encounter (Signed)
Please call needs TOC app

## 2019-09-07 NOTE — Telephone Encounter (Signed)
Please review

## 2019-09-08 NOTE — Telephone Encounter (Signed)
Scheduled pt TOC appt with Jarold Motto.

## 2019-09-14 ENCOUNTER — Ambulatory Visit (INDEPENDENT_AMBULATORY_CARE_PROVIDER_SITE_OTHER): Admitting: Physician Assistant

## 2019-09-14 ENCOUNTER — Other Ambulatory Visit: Payer: Self-pay

## 2019-09-14 ENCOUNTER — Encounter: Payer: Self-pay | Admitting: Physician Assistant

## 2019-09-14 VITALS — BP 110/74 | HR 67 | Temp 98.1°F | Ht 65.0 in | Wt 185.6 lb

## 2019-09-14 DIAGNOSIS — F320 Major depressive disorder, single episode, mild: Secondary | ICD-10-CM

## 2019-09-14 MED ORDER — BUPROPION HCL ER (XL) 150 MG PO TB24: 150.0000 mg | ORAL_TABLET | Freq: Every day | ORAL | 1 refills | Status: DC

## 2019-09-14 NOTE — Patient Instructions (Signed)
It was great to see you!  Start Wellbutrin 150 mg daily.  Let's follow-up in 1 month, sooner if you have concerns.  Take care,  Jarold Motto PA-C

## 2019-09-14 NOTE — Progress Notes (Signed)
Jacqueline Giles is a 24 y.o. female is here to transfer care.  I acted as a Education administrator for [employer], PA-C [employer], [place]  History of Present Illness:   Chief Complaint  Patient presents with  . Transitions Of Care    HPI   Depression Started Zoloft in 2019 and was most recently on 25 mg daily up until 2 weeks ago when she ran out. She is currently working full time managing a Geologist, engineering and is in school, graduating in May 2022. Has seen a therapist in the past but has scheduled a visit recently. No significant concerns with anxious thoughts. Feels like she is lacking motivation to do things, and her grades and weight have suffered because of this. Denies SI/HI.  Depression screen [facility] 2/9 09/14/2019 04/15/2018 01/24/2018 02/21/2017  Decreased Interest 1 0 3 0  Down, Depressed, Hopeless 0 0 3 0  PHQ - 2 Score 1 0 6 0  Altered sleeping 1 1 3  -  Tired, decreased energy 3 2 3  -  Change in appetite 0 0 2 -  Feeling bad or failure about yourself  0 0 3 -  Trouble concentrating 0 0 3 -  Moving slowly or fidgety/restless 3 0 2 -  Suicidal thoughts 0 0 1 -  PHQ-9 Score 8 3 23  -  Difficult doing work/chores Somewhat difficult Not difficult at all Very difficult -     Health Maintenance Due  Topic Date Due  . COVID-19 Vaccine (1) Never done    Past Medical History:  Diagnosis Date  . Depression   . History of chlamydia      Social History   Socioeconomic History  . Marital status: Single    Spouse name: Not on file  . Number of children: Not on file  . Years of education: Not on file  . Highest education level: Not on file  Occupational History  . Not on file  Tobacco Use  . Smoking status: Never Smoker  . Smokeless tobacco: Never Used  Substance and Sexual Activity  . Alcohol use: Yes    Comment: Social   . Drug use: No  . Sexual activity: Yes    Partners: Male    Birth control/protection: I.U.D.    Comment: Mirena 09-03-17   Other Topics Concern  . Not  on file  Social History Narrative  . Not on file   Social Determinants of Health   Financial Resource Strain:   . Difficulty of Paying Living Expenses:   Food Insecurity:   . Worried About Charity fundraiser in the Last Year:   . Arboriculturist in the Last Year:   Transportation Needs:   . Film/video editor (Medical):   Marland Kitchen Lack of Transportation (Non-Medical):   Physical Activity:   . Days of Exercise per Week:   . Minutes of Exercise per Session:   Stress:   . Feeling of Stress :   Social Connections:   . Frequency of Communication with Friends and Family:   . Frequency of Social Gatherings with Friends and Family:   . Attends Religious Services:   . Active Member of Clubs or Organizations:   . Attends Archivist Meetings:   Marland Kitchen Marital Status:   Intimate Partner Violence:   . Fear of Current or Ex-Partner:   . Emotionally Abused:   Marland Kitchen Physically Abused:   . Sexually Abused:     Past Surgical History:  Procedure Laterality Date  .  INTRAUTERINE DEVICE (IUD) INSERTION  09/03/2017   Mirena   . LAPAROSCOPIC APPENDECTOMY N/A 07/08/2019   Procedure: APPENDECTOMY LAPAROSCOPIC;  Surgeon: Abigail Miyamoto, MD;  Location: [facility];  Service: General;  Laterality: N/A;  . TONSILLECTOMY  04/2017    Family History  Problem Relation Age of Onset  . Deep vein thrombosis Father 25    PMHx, SurgHx, SocialHx, FamHx, Medications, and Allergies were reviewed in the Visit Navigator and updated as appropriate.   Patient Active Problem List   Diagnosis Date Noted  . Acute appendicitis 07/08/2019  . History of chlamydia   . Depression, major, single episode, moderate (HCC) 04/15/2018    Social History   Tobacco Use  . Smoking status: Never Smoker  . Smokeless tobacco: Never Used  Substance Use Topics  . Alcohol use: Yes    Comment: Social   . Drug use: No    Current Medications and Allergies:    Current Outpatient Medications:  .  levonorgestrel (MIRENA) 20  MCG/24HR IUD, 1 each by Intrauterine route once., Disp: , Rfl:  .  Multiple Vitamin (MULTIVITAMIN ADULT) TABS, Take 1 tablet by mouth daily., Disp: , Rfl:  .  sertraline (ZOLOFT) 25 MG tablet, Take 25 mg by mouth daily., Disp: , Rfl:  .  buPROPion (WELLBUTRIN XL) 150 MG 24 hr tablet, Take 1 tablet (150 mg total) by mouth daily., Disp: 30 tablet, Rfl: 1  No Known Allergies  Review of Systems   ROS  Negative unless otherwise specified per HPI.  Vitals:   Vitals:   09/14/19 1328  BP: 110/74  Pulse: 67  Temp: 98.1 F (36.7 C)  TempSrc: Temporal  SpO2: 99%  Weight: 185 lb 9.6 oz (84.2 kg)  Height: 5\' 5"  (1.651 m)     Body mass index is 30.89 kg/m.   Physical Exam:    Physical Exam Vitals and nursing note reviewed.  Constitutional:      General: She is not in acute distress.    Appearance: She is well-developed. She is not ill-appearing or toxic-appearing.  Cardiovascular:     Rate and Rhythm: Normal rate and regular rhythm.     Pulses: Normal pulses.     Heart sounds: Normal heart sounds, S1 normal and S2 normal.     Comments: No LE edema Pulmonary:     Effort: Pulmonary effort is normal.     Breath sounds: Normal breath sounds.  Skin:    General: Skin is warm and dry.  Neurological:     Mental Status: She is alert.     GCS: GCS eye subscore is 4. GCS verbal subscore is 5. GCS motor subscore is 6.  Psychiatric:        Speech: Speech normal.        Behavior: Behavior normal. Behavior is cooperative.      Assessment and Plan:    Cybill was seen today for transitions of care.  Diagnoses and all orders for this visit:  Depression, major, single episode, mild (HCC) Uncontrolled. Discussed starting Wellbutrin vs Prozac. She would like to trial Wellbutrin. We will start 150 mg daily Wellbutrin and follow-up in 1 month, sooner if concerns.  Other orders -     buPROPion (WELLBUTRIN XL) 150 MG 24 hr tablet; Take 1 tablet (150 mg total) by mouth  daily.    . Reviewed expectations re: course of current medical issues. . Discussed self-management of symptoms. . Outlined signs and symptoms indicating need for more acute intervention. . Patient verbalized understanding and  all questions were answered. . See orders for this visit as documented in the electronic medical record. . Patient received an After Visit Summary.  CMA or LPN served as scribe during this visit. History, Physical, and Plan performed by medical provider. The above documentation has been reviewed and is accurate and complete.   Jarold Motto, PA-C [place] 09/14/2019  Follow-up: No follow-ups on file.

## 2019-10-16 ENCOUNTER — Encounter: Payer: Self-pay | Admitting: Physician Assistant

## 2019-10-16 ENCOUNTER — Other Ambulatory Visit: Payer: Self-pay

## 2019-10-16 ENCOUNTER — Ambulatory Visit (INDEPENDENT_AMBULATORY_CARE_PROVIDER_SITE_OTHER): Admitting: Physician Assistant

## 2019-10-16 VITALS — BP 118/74 | HR 78 | Temp 98.2°F | Ht 65.0 in | Wt 189.4 lb

## 2019-10-16 DIAGNOSIS — F320 Major depressive disorder, single episode, mild: Secondary | ICD-10-CM

## 2019-10-16 MED ORDER — BUPROPION HCL ER (XL) 150 MG PO TB24: 150.0000 mg | ORAL_TABLET | Freq: Every day | ORAL | 1 refills | Status: DC

## 2019-10-16 NOTE — Patient Instructions (Signed)
It was great to see you!  Please make it a priority to schedule with your therapist. Continue current medication regimen.  Let's follow-up in 3 months, sooner if you have concerns.  Take care,  Jarold Motto PA-C

## 2019-10-16 NOTE — Progress Notes (Signed)
Jacqueline Giles is a 24 y.o. female here for a follow up of a pre-existing problem.  History of Present Illness:   Chief Complaint  Patient presents with  . Depression    HPI   Depression Patient is here to follow-up on depression. Last visit with me on 09/14/19. She was started on Wellbutrin 150 mg extended release daily. She feels like she has more energy. She is currently taking a break from school (due to summer.) She continues to work full time. Planning to move apartments and looking for a new apartment. She does admit to having anxious thoughts with planning her future. She has not scheduled to see her usual therapist yet. Denies SI/HI.  Wt Readings from Last 4 Encounters:  10/16/19 189 lb 6.1 oz (85.9 kg)  09/14/19 185 lb 9.6 oz (84.2 kg)  07/08/19 165 lb (74.8 kg)  06/24/19 182 lb 6.4 oz (82.7 kg)     Depression screen [facility] 2/9 10/16/2019 09/14/2019 04/15/2018 01/24/2018 02/21/2017  Decreased Interest 0 1 0 3 0  Down, Depressed, Hopeless 1 0 0 3 0  PHQ - 2 Score 1 1 0 6 0  Altered sleeping 1 1 1 3  -  Tired, decreased energy 1 3 2 3  -  Change in appetite 0 0 0 2 -  Feeling bad or failure about yourself  0 0 0 3 -  Trouble concentrating 1 0 0 3 -  Moving slowly or fidgety/restless 0 3 0 2 -  Suicidal thoughts 0 0 0 1 -  PHQ-9 Score 4 8 3 23  -  Difficult doing work/chores Not difficult at all Somewhat difficult Not difficult at all Very difficult -     Past Medical History:  Diagnosis Date  . Depression   . History of chlamydia      Social History   Tobacco Use  . Smoking status: Never Smoker  . Smokeless tobacco: Never Used  Vaping Use  . Vaping Use: Never used  Substance Use Topics  . Alcohol use: Yes    Comment: Social   . Drug use: No    Past Surgical History:  Procedure Laterality Date  . INTRAUTERINE DEVICE (IUD) INSERTION  09/03/2017   Mirena   . LAPAROSCOPIC APPENDECTOMY N/A 07/08/2019   Procedure: APPENDECTOMY LAPAROSCOPIC;  Surgeon: Coralie Keens, MD;  Location: [facility];  Service: General;  Laterality: N/A;  . TONSILLECTOMY  04/2017    Family History  Problem Relation Age of Onset  . Deep vein thrombosis Father 45    No Known Allergies  Current Medications:   Current Outpatient Medications:  .  buPROPion (WELLBUTRIN XL) 150 MG 24 hr tablet, Take 1 tablet (150 mg total) by mouth daily., Disp: 90 tablet, Rfl: 1 .  levonorgestrel (MIRENA) 20 MCG/24HR IUD, 1 each by Intrauterine route once., Disp: , Rfl:  .  Multiple Vitamin (MULTIVITAMIN ADULT) TABS, Take 1 tablet by mouth daily., Disp: , Rfl:    Review of Systems:   ROS  Negative unless otherwise specified per HPI.  Vitals:   Vitals:   10/16/19 0803  BP: 118/74  Pulse: 78  Temp: 98.2 F (36.8 C)  SpO2: 98%  Weight: 189 lb 6.1 oz (85.9 kg)  Height: 5\' 5"  (1.651 m)     Body mass index is 31.51 kg/m.  Physical Exam:   Physical Exam Vitals and nursing note reviewed.  Constitutional:      General: She is not in acute distress.    Appearance: She is well-developed. She is  not ill-appearing or toxic-appearing.  Cardiovascular:     Rate and Rhythm: Normal rate and regular rhythm.     Pulses: Normal pulses.     Heart sounds: Normal heart sounds, S1 normal and S2 normal.     Comments: No LE edema Pulmonary:     Effort: Pulmonary effort is normal.     Breath sounds: Normal breath sounds.  Skin:    General: Skin is warm and dry.  Neurological:     Mental Status: She is alert.     GCS: GCS eye subscore is 4. GCS verbal subscore is 5. GCS motor subscore is 6.  Psychiatric:        Speech: Speech normal.        Behavior: Behavior normal. Behavior is cooperative.     Assessment and Plan:   [name] was seen today for depression.  Diagnoses and all orders for this visit:  Depression, major, single episode, mild (HCC)  Other orders -     buPROPion (WELLBUTRIN XL) 150 MG 24 hr tablet; Take 1 tablet (150 mg total) by mouth daily.   Will continue  Wellbutrin XL 150 mg daily. Encouraged her to reach out to her therapist for an appointment. I recommended that she follow-up with me in 3 months (she will be back in school then) and if needed, we can discuss increasing Wellbutrin vs adding in low dose Zoloft.  . Reviewed expectations re: course of current medical issues. . Discussed self-management of symptoms. . Outlined signs and symptoms indicating need for more acute intervention. . Patient verbalized understanding and all questions were answered. . See orders for this visit as documented in the electronic medical record. . Patient received an After-Visit Summary.  Jarold Motto, PA-C

## 2020-01-18 ENCOUNTER — Other Ambulatory Visit: Payer: Self-pay

## 2020-01-18 ENCOUNTER — Ambulatory Visit (INDEPENDENT_AMBULATORY_CARE_PROVIDER_SITE_OTHER): Admitting: Physician Assistant

## 2020-01-18 ENCOUNTER — Encounter: Payer: Self-pay | Admitting: Physician Assistant

## 2020-01-18 VITALS — BP 110/70 | HR 76 | Temp 98.1°F | Ht 65.0 in | Wt 181.0 lb

## 2020-01-18 DIAGNOSIS — B9689 Other specified bacterial agents as the cause of diseases classified elsewhere: Secondary | ICD-10-CM

## 2020-01-18 DIAGNOSIS — F418 Other specified anxiety disorders: Secondary | ICD-10-CM

## 2020-01-18 DIAGNOSIS — N760 Acute vaginitis: Secondary | ICD-10-CM

## 2020-01-18 DIAGNOSIS — F320 Major depressive disorder, single episode, mild: Secondary | ICD-10-CM | POA: Diagnosis not present

## 2020-01-18 DIAGNOSIS — K625 Hemorrhage of anus and rectum: Secondary | ICD-10-CM | POA: Diagnosis not present

## 2020-01-18 MED ORDER — PROPRANOLOL HCL 20 MG PO TABS: 20.0000 mg | ORAL_TABLET | Freq: Three times a day (TID) | ORAL | 1 refills | Status: DC | PRN

## 2020-01-18 MED ORDER — METRONIDAZOLE 500 MG PO TABS: 500.0000 mg | ORAL_TABLET | Freq: Two times a day (BID) | ORAL | 0 refills | Status: AC

## 2020-01-18 MED ORDER — HYDROCORTISONE ACETATE 25 MG RE SUPP: 25.0000 mg | Freq: Two times a day (BID) | RECTAL | 0 refills | Status: DC

## 2020-01-18 MED ORDER — METRONIDAZOLE 0.75 % VA GEL: 1.0000 | VAGINAL | 5 refills | Status: DC

## 2020-01-18 NOTE — Progress Notes (Signed)
Jacqueline Giles is a 24 y.o. female is here for follow up.  I acted as a Neurosurgeon for [employer], PA-C Corky Mull, LPN   History of Present Illness:   Chief Complaint  Patient presents with  . Depression    HPI   Depression; Anxiety Pt following up today, currently taking Wellbutrin 150 mg daily, pt stopped about a month ago, she would miss a few days and would feel fine so she felt she did not need it. Denies SI/HI. She is having very situational anxiety at this time. She has concerns with anxiety before tests and when school is stressful and it causes panic attack-like symptoms. She has never been on medications on an as needed basis in the past.  Rectal bleeding Has had ongoing issues with hemorrhoids, uses OTC suppositories to help with this. She reports that she had an episode a few weeks ago where she had significant bleeding in the toilet after bowel movement. None since. Dealing with constipation/straining. Denies: abdominal pain, family hx colon cancer/IBD, lightheadedness/dizziness.  Recurrent BV States that she has had recurrent BV, approximately 3 times or so far this year. She noticed these issues since getting her IUD put in. She has tried both oral and vaginal flagyl in the past. She reports that she is having another episode of this and is concerned. She is currently having vaginal discharge and odor.   There are no preventive care reminders to display for this patient.  Past Medical History:  Diagnosis Date  . Depression   . History of chlamydia      Social History   Tobacco Use  . Smoking status: Never Smoker  . Smokeless tobacco: Never Used  Vaping Use  . Vaping Use: Never used  Substance Use Topics  . Alcohol use: Yes    Comment: Social   . Drug use: No    Past Surgical History:  Procedure Laterality Date  . INTRAUTERINE DEVICE (IUD) INSERTION  09/03/2017   Mirena   . LAPAROSCOPIC APPENDECTOMY N/A 07/08/2019   Procedure:  APPENDECTOMY LAPAROSCOPIC;  Surgeon: Abigail Miyamoto, MD;  Location: [facility];  Service: General;  Laterality: N/A;  . TONSILLECTOMY  04/2017    Family History  Problem Relation Age of Onset  . Deep vein thrombosis Father 24    PMHx, SurgHx, SocialHx, FamHx, Medications, and Allergies were reviewed in the Visit Navigator and updated as appropriate.   Patient Active Problem List   Diagnosis Date Noted  . Depression, major, single episode, mild (HCC) 09/14/2019  . History of chlamydia     Social History   Tobacco Use  . Smoking status: Never Smoker  . Smokeless tobacco: Never Used  Vaping Use  . Vaping Use: Never used  Substance Use Topics  . Alcohol use: Yes    Comment: Social   . Drug use: No    Current Medications and Allergies:    Current Outpatient Medications:  .  levonorgestrel (MIRENA) 20 MCG/24HR IUD, 1 each by Intrauterine route once., Disp: , Rfl:  .  hydrocortisone (ANUSOL-HC) 25 MG suppository, Place 1 suppository (25 mg total) rectally 2 (two) times daily., Disp: 12 suppository, Rfl: 0 .  metroNIDAZOLE (FLAGYL) 500 MG tablet, Take 1 tablet (500 mg total) by mouth 2 (two) times daily for 10 days., Disp: 20 tablet, Rfl: 0 .  metroNIDAZOLE (METROGEL) 0.75 % vaginal gel, Place 1 Applicatorful vaginally 2 (two) times a week. For 4-6 months total., Disp: 70 g, Rfl: 5 .  propranolol (INDERAL) 20  MG tablet, Take 1 tablet (20 mg total) by mouth 3 (three) times daily as needed (anxiety)., Disp: 60 tablet, Rfl: 1  No Known Allergies  Review of Systems   ROS  Negative unless otherwise specified per HPI.  Vitals:   Vitals:   01/18/20 1339  BP: 110/70  Pulse: 76  Temp: 98.1 F (36.7 C)  TempSrc: Temporal  SpO2: 97%  Weight: 181 lb (82.1 kg)  Height: 5\' 5"  (1.651 m)     Body mass index is 30.12 kg/m.   Physical Exam:    Physical Exam Vitals and nursing note reviewed.  Constitutional:      General: She is not in acute distress.    Appearance: She is  well-developed. She is not ill-appearing or toxic-appearing.  Cardiovascular:     Rate and Rhythm: Normal rate and regular rhythm.     Pulses: Normal pulses.     Heart sounds: Normal heart sounds, S1 normal and S2 normal.     Comments: No LE edema Pulmonary:     Effort: Pulmonary effort is normal.     Breath sounds: Normal breath sounds.  Skin:    General: Skin is warm and dry.  Neurological:     Mental Status: She is alert.     GCS: GCS eye subscore is 4. GCS verbal subscore is 5. GCS motor subscore is 6.  Psychiatric:        Speech: Speech normal.        Behavior: Behavior normal. Behavior is cooperative.      Assessment and Plan:    Jacqueline Giles was seen today for depression.  Diagnoses and all orders for this visit:  Rectal bleeding Given significant bleeding episode, will refer to GI. Anusol suppository rx provided. Constipation handout provided and discussed. Worsening precautions advised. -     Ambulatory referral to Gastroenterology  Depression, major, single episode, mild (HCC) Overall well controlled per patient. Continue to hold medication as long as symptoms are well controlled. Follow-up as needed.  Situational anxiety Uncontrolled. Trial propranolol 20 mg TID prn for anxiety. Follow-up if no improvement of symptoms.  Bacterial vaginitis Trial oral flagyl x 10 days. Then maintenance metrogel twice weekly x 4-6 months. Follow-up with ob-gyn if symptoms persist.  Other orders -     propranolol (INDERAL) 20 MG tablet; Take 1 tablet (20 mg total) by mouth 3 (three) times daily as needed (anxiety). -     hydrocortisone (ANUSOL-HC) 25 MG suppository; Place 1 suppository (25 mg total) rectally 2 (two) times daily. -     metroNIDAZOLE (FLAGYL) 500 MG tablet; Take 1 tablet (500 mg total) by mouth 2 (two) times daily for 10 days. -     metroNIDAZOLE (METROGEL) 0.75 % vaginal gel; Place 1 Applicatorful vaginally 2 (two) times a week. For 4-6 months total.   CMA or  LPN served as scribe during this visit. History, Physical, and Plan performed by medical provider. The above documentation has been reviewed and is accurate and complete.  Zollie Scale, PA-C , [place] [facility] 01/18/2020  Follow-up: No follow-ups on file.

## 2020-01-18 NOTE — Patient Instructions (Addendum)
It was great to see you!  For your anxiety: Lets trial propranolol as needed.  For your recurrent BV: Lets trial oral flagyl x 10 days Then, after this, start twice weekly metrogel If no improvement or if other concerns, please reach out to Dr. Oscar La  For your rectal bleeding/hemorrhoids: Trial anusol suppositories Work on preventing constipation -- fiber, fluids, exercise GI referral placed today  Take care,  Jarold Motto PA-C

## 2020-05-02 ENCOUNTER — Other Ambulatory Visit

## 2020-05-02 DIAGNOSIS — Z20822 Contact with and (suspected) exposure to covid-19: Secondary | ICD-10-CM

## 2020-05-03 LAB — NOVEL CORONAVIRUS, NAA: SARS-CoV-2, NAA: DETECTED — AB

## 2020-05-03 LAB — SARS-COV-2, NAA 2 DAY TAT

## 2020-08-25 ENCOUNTER — Ambulatory Visit: Payer: BC Managed Care – PPO | Admitting: Family Medicine

## 2020-08-25 ENCOUNTER — Other Ambulatory Visit: Payer: Self-pay

## 2020-08-25 ENCOUNTER — Encounter: Payer: Self-pay | Admitting: Family Medicine

## 2020-08-25 VITALS — BP 121/80 | HR 89 | Temp 98.3°F | Ht 65.0 in | Wt 193.0 lb

## 2020-08-25 DIAGNOSIS — N760 Acute vaginitis: Secondary | ICD-10-CM | POA: Diagnosis not present

## 2020-08-25 DIAGNOSIS — R350 Frequency of micturition: Secondary | ICD-10-CM

## 2020-08-25 DIAGNOSIS — M2550 Pain in unspecified joint: Secondary | ICD-10-CM | POA: Diagnosis not present

## 2020-08-25 DIAGNOSIS — B9689 Other specified bacterial agents as the cause of diseases classified elsewhere: Secondary | ICD-10-CM

## 2020-08-25 LAB — POCT URINE PREGNANCY: Preg Test, Ur: NEGATIVE

## 2020-08-25 LAB — POC URINALSYSI DIPSTICK (AUTOMATED)
Bilirubin, UA: NEGATIVE
Blood, UA: NEGATIVE
Glucose, UA: NEGATIVE
Ketones, UA: NEGATIVE
Leukocytes, UA: NEGATIVE
Nitrite, UA: NEGATIVE
Protein, UA: NEGATIVE
Spec Grav, UA: 1.02 (ref 1.010–1.025)
Urobilinogen, UA: 1 E.U./dL
pH, UA: 6 (ref 5.0–8.0)

## 2020-08-25 NOTE — Patient Instructions (Signed)
It was very nice to see you today!  Please start the Macrobid for your UTI.  We will check a urine culture and touch base next week once results come back [facility].  Please make sure that you are getting plenty of fluids.  Let us know if your symptoms worsen over the next few days.  It is okay for you to take an extra couple doses of your metronidazole gel over the next 2 days while on the antibiotic.  You can try taking a supplement called glucosamine chondroitin to help with your joint pain.  Take care, Dr Jimmey Ralph  PLEASE NOTE:  If you had any lab tests please let us know if you have not heard back within a few days. You may see your results on mychart before we have a chance to review them but we will give you a call once they are reviewed by [place]. If we ordered any referrals today, please let us know if you have not heard from their office within the next week.   Please try these tips to maintain a healthy lifestyle:   Eat at least 3 REAL meals and 1-2 snacks per day.  Aim for no more than 5 hours between eating.  If you eat breakfast, please do so within one hour of getting up.    Each meal should contain half fruits/vegetables, one quarter protein, and one quarter carbs (no bigger than a computer mouse)   Cut down on sweet beverages. This includes juice, soda, and sweet tea.     Drink at least 1 glass of water with each meal and aim for at least 8 glasses per day   Exercise at least 150 minutes every week.

## 2020-08-25 NOTE — Addendum Note (Signed)
Addended by: Ronelle Nigh on: 08/25/2020 08:45 AM   Modules accepted: Orders

## 2020-08-25 NOTE — Progress Notes (Signed)
   Jacqueline Giles is a 25 y.o. female who presents today for an office visit.  Assessment/Plan:  New/Acute Problems: UTI UA negative though history consistent with UTI.  Will start empiric Macrobid.  No signs of systemic infection.  Check urine culture.  Encourage good oral hydration.  Can use Azo as needed.  Discussed reasons to return to care.  Chronic Problems Addressed Today: Recurrent BV Patient prone to BV outbreaks while on antibiotics.  She is on prophylactic metronidazole gel twice monthly.  Advised patient to do this every 2 to 3 days while on Macrobid.  Joint pain No red flags.  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  Recommended glucosamine/chondroitin trial.  She can use over-the-counter meds such as ibuprofen and Tylenol as needed as well.    Subjective:  HPI:  Patient here with UTI symptoms.  Feels similar to previous UTIs.  More frequency and urgency.  Some fatigue.  Some back pain.  Has a history of kidney stones.  No fevers or chills.       Objective:  Physical Exam: There were no vitals taken for this visit.  Gen: No acute distress, resting comfortably CV: Regular rate and rhythm with no murmurs appreciated Pulm: Normal work of breathing, clear to auscultation bilaterally with no crackles, wheezes, or rhonchi Neuro: Grossly normal, moves all extremities Psych: Normal affect and thought content      Jacqueline Giles M. Jimmey Ralph, MD 08/25/2020 8:06 AM

## 2020-08-26 ENCOUNTER — Telehealth: Payer: Self-pay

## 2020-08-26 ENCOUNTER — Other Ambulatory Visit: Payer: Self-pay

## 2020-08-26 DIAGNOSIS — R350 Frequency of micturition: Secondary | ICD-10-CM

## 2020-08-26 MED ORDER — NITROFURANTOIN MONOHYD MACRO 100 MG PO CAPS: 100.0000 mg | ORAL_CAPSULE | Freq: Two times a day (BID) | ORAL | 0 refills | Status: DC

## 2020-08-26 NOTE — Telephone Encounter (Signed)
Patient is calling in stating that the pharmacy hasnt received the antibiotic, wondering if it can be sent to [facility] Address: [address] [place], [place] [address].

## 2020-08-26 NOTE — Telephone Encounter (Signed)
Left patient a voicemail regarding medication, have sent medicine to requested pharmacy.

## 2020-08-27 LAB — URINE CULTURE
MICRO NUMBER:: 11797629
SPECIMEN QUALITY:: ADEQUATE

## 2020-08-29 ENCOUNTER — Ambulatory Visit (INDEPENDENT_AMBULATORY_CARE_PROVIDER_SITE_OTHER): Payer: BC Managed Care – PPO | Admitting: Family Medicine

## 2020-08-29 ENCOUNTER — Encounter: Payer: Self-pay | Admitting: Family Medicine

## 2020-08-29 DIAGNOSIS — R21 Rash and other nonspecific skin eruption: Secondary | ICD-10-CM

## 2020-08-29 DIAGNOSIS — B354 Tinea corporis: Secondary | ICD-10-CM | POA: Diagnosis not present

## 2020-08-29 MED ORDER — TERBINAFINE HCL 250 MG PO TABS: 250.0000 mg | ORAL_TABLET | Freq: Every day | ORAL | 0 refills | Status: DC

## 2020-08-29 MED ORDER — CLOTRIMAZOLE-BETAMETHASONE 1-0.05 % EX CREA: 1.0000 "application " | TOPICAL_CREAM | Freq: Every day | CUTANEOUS | 0 refills | Status: DC

## 2020-08-29 MED ORDER — CLOTRIMAZOLE-BETAMETHASONE 1-0.05 % EX CREA: 1.0000 "application " | TOPICAL_CREAM | Freq: Two times a day (BID) | CUTANEOUS | 0 refills | Status: DC

## 2020-08-29 NOTE — Progress Notes (Signed)
Please inform patient of the following:  Her urine culture confirms UTI. The antibiotic we started her on should treat this. Would like for her to let us know if her symptoms are not improving.  Jacqueline Giles. Jimmey Ralph, MD 08/29/2020 8:16 AM

## 2020-08-29 NOTE — Progress Notes (Signed)
Virtual Visit via audio note  I connected with Jacqueline Giles on 08/29/20 at 12:12 PM by audio and verified that I am speaking with the correct person using two identifiers.  Patient location: home My location: office   I discussed the limitations, risks, security and privacy concerns of performing an evaluation and management service by telephone and the availability of in person appointments. I also discussed with the patient that there may be a patient responsible charge related to this service. The patient expressed understanding and agreed to proceed, consent obtained  Chief complaint:  Chief Complaint  Patient presents with  . Rash    Pt  made appt due to rash popping up on arms legs and torso a few weeks ago, reports it is dry and itchy, ring shaped. 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       History of Present Illness: Jacqueline Giles is a 25 y.o. female   Started 2 weeks ago - noted a rash on hands, wrists. Has moved to both arms, stomach, thighs. No genital or mouth lesions.  No recent change in dermatologic products.  Started macrobid last week for UTI, but rash there prior and spreading prior.  Round shape on arms. Ring shaped - dark in center, slight elevated border, some other areas are bumpy. Itching is main concern.  Used black light on it today and it glowed yellow-green.  Attempted treatments: benadryl cream - min relief.  No fever, feels well otherwise  Works in Therapist, sports. Has had ringworm a few times prior.   Patient Active Problem List   Diagnosis Date Noted  . Depression, major, single episode, mild (HCC) 09/14/2019  . History of chlamydia    Past Medical History:  Diagnosis Date  . Depression   . History of chlamydia    Past Surgical History:  Procedure Laterality Date  . INTRAUTERINE DEVICE (IUD) INSERTION  09/03/2017   Mirena    . LAPAROSCOPIC APPENDECTOMY N/A 07/08/2019   Procedure: APPENDECTOMY LAPAROSCOPIC;  Surgeon: Abigail Miyamoto, MD;  Location: [facility];  Service: General;  Laterality: N/A;  . TONSILLECTOMY  04/2017   No Known Allergies Prior to Admission medications   Medication Sig Start Date End Date Taking? Authorizing Provider  levonorgestrel (MIRENA) 20 MCG/24HR IUD 1 each by Intrauterine route once.   Yes [provider]  nitrofurantoin, macrocrystal-monohydrate, (MACROBID) 100 MG capsule Take 1 capsule (100 mg total) by mouth 2 (two) times daily. 08/26/20  Yes Ardith Dark, MD   Social History   Socioeconomic History  . Marital status: Single    Spouse name: Not on file  . Number of children: Not on file  . Years of education: Not on file  . Highest education level: Not on file  Occupational History  . Not on file  Tobacco Use  . Smoking status: Never Smoker  . Smokeless tobacco: Never Used  Vaping Use  . Vaping Use: Never used  Substance and Sexual Activity  . Alcohol use: Yes    Comment: Social   . Drug use: No  . Sexual activity: Yes    Partners: Male    Birth control/protection: I.U.D.    Comment: Mirena 09-03-17   Other Topics Concern  . Not on file  Social History Narrative  . Not on file   Social Determinants of Health  Financial Resource Strain: Not on file  Food Insecurity: Not on file  Transportation Needs: Not on file  Physical Activity: Not on file  Stress: Not on file  Social Connections: Not on file  Intimate Partner Violence: Not on file    Observations/Objective: Normal speech, no distress, appropriate responses.  All questions answered with understanding of plan expressed  Assessment and Plan: Rash and nonspecific skin eruption - Plan: clotrimazole-betamethasone (LOTRISONE) cream, terbinafine (LAMISIL) 250 MG tablet  Tinea corporis - Plan: clotrimazole-betamethasone (LOTRISONE) cream, terbinafine (LAMISIL) 250 MG tablet  Based on her  description of the rash, I am suspicious for tinea corporis, diffuse involvement.  We will start Lamisil 250 mg p.o. daily x2 weeks.  Lotrisone topical twice daily for 1 to 2 weeks maximum.  Potential pigment changes with use of topical steroid discussed with understanding expressed.  RTC precautions if rash is not improving or continue to spread.  Follow Up Instructions: If rash not improving with treatment above, sooner if worsening or new symptoms.   I discussed the assessment and treatment plan with the patient. The patient was provided an opportunity to ask questions and all were answered. The patient agreed with the plan and demonstrated an understanding of the instructions.   The patient was advised to call back or seek an in-person evaluation if the symptoms worsen or if the condition fails to improve as anticipated.  I provided 20 minutes of non-face-to-face time during this encounter.   Shade Flood, MD

## 2020-09-13 ENCOUNTER — Other Ambulatory Visit: Payer: Self-pay

## 2020-09-13 ENCOUNTER — Ambulatory Visit: Payer: BC Managed Care – PPO | Admitting: Family Medicine

## 2020-09-13 ENCOUNTER — Encounter: Payer: Self-pay | Admitting: Family Medicine

## 2020-09-13 VITALS — BP 122/72 | HR 93 | Temp 98.3°F | Ht 65.0 in | Wt 190.8 lb

## 2020-09-13 DIAGNOSIS — B354 Tinea corporis: Secondary | ICD-10-CM

## 2020-09-13 DIAGNOSIS — B86 Scabies: Secondary | ICD-10-CM | POA: Diagnosis not present

## 2020-09-13 MED ORDER — HYDROXYZINE PAMOATE 25 MG PO CAPS: 25.0000 mg | ORAL_CAPSULE | Freq: Three times a day (TID) | ORAL | 1 refills | Status: DC | PRN

## 2020-09-13 MED ORDER — PERMETHRIN 5 % EX CREA: 1.0000 "application " | TOPICAL_CREAM | Freq: Once | CUTANEOUS | 1 refills | Status: AC

## 2020-09-13 NOTE — Progress Notes (Signed)
Subjective  CC:  Chief Complaint  Patient presents with  . Rash    States it is everywhere, worse on both arms and back of thighs. Itches. Had a virtual visit with PCP. On going for a month    HPI: Jacqueline Giles is a 25 y.o. female who presents to the office today to address the problems listed above in the chief complaint.  Very pleasant 25 year old female who was seen a few weeks ago virtually for rashes.  At that time she was diagnosed with rash, nonspecific and tinea corporis.  Treatment Lotrisone and oral Lamisil.  Fortunately, the ringworm lesions have resolved.  However she has a diffuse worsening very itchy rash that persists.  Worse on hands, and between fingers, arms and legs.  Is also on her torso.  No hives or vesicles.  No associated systemic symptoms.  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  No contacts with similar rashes.  Itching is interfering with sleep.   Assessment  1. Scabies   2. Tinea corporis      Plan   Scabies: Education given.  Elimite treatment, discussed appropriate use and expectations.  Hydroxyzine for itching.  See after visit summary for handout.  Follow-up if not improving.  Tinea corporis: Resolved stop clobetasol.  Follow up: As needed Visit date not found  No orders of the defined types were placed in this encounter.  Meds ordered this encounter  Medications  . hydrOXYzine (VISTARIL) 25 MG capsule    Sig: Take 1 capsule (25 mg total) by mouth every 8 (eight) hours as needed.    Dispense:  30 capsule    Refill:  1  . permethrin (ELIMITE) 5 % cream    Sig: Apply 1 application topically once for 1 dose. You may repeat treatment in 1 week if needed.    Dispense:  60 g    Refill:  1      I reviewed the patients updated PMH, FH, and SocHx.    Patient Active Problem List   Diagnosis Date Noted  . Depression, major, single episode, mild (HCC) 09/14/2019  . History of chlamydia    Current Meds  Medication Sig   . hydrOXYzine (VISTARIL) 25 MG capsule Take 1 capsule (25 mg total) by mouth every 8 (eight) hours as needed.  Marland Kitchen levonorgestrel (MIRENA) 20 MCG/24HR IUD 1 each by Intrauterine route once.  . permethrin (ELIMITE) 5 % cream Apply 1 application topically once for 1 dose. You may repeat treatment in 1 week if needed.    Allergies: Patient has No Known Allergies. Family History: Patient family history includes Deep vein thrombosis (age of onset: 19) in her father. Social History:  Patient  reports that she has never smoked. She has never used smokeless tobacco. She reports current alcohol use. She reports that she does not use drugs.  Review of Systems: Constitutional: Negative for fever malaise or anorexia Cardiovascular: negative for chest pain Respiratory: negative for SOB or persistent cough Gastrointestinal: negative for abdominal pain  Objective  Vitals: BP 122/72   Pulse 93   Temp 98.3 F (36.8 C) (Temporal)   Ht 5\' 5"  (1.651 m)   Wt 190 lb 12.8 oz (86.5 kg)   SpO2 99%   BMI 31.75 kg/m  General: no acute distress , A&Ox3 sounds bilaterally, CTAB with normal respiratory effort Skin:  Warm, diffuse maculopapular rash noted on wrists, hands, torso and legs.  No vesicles, no hives.     Commons  side effects, risks, benefits, and alternatives for medications and treatment plan prescribed today were discussed, and the patient expressed understanding of the given instructions. Patient is instructed to call or message via MyChart if he/she has any questions or concerns regarding our treatment plan. No barriers to understanding were identified. We discussed Red Flag symptoms and signs in detail. Patient expressed understanding regarding what to do in case of urgent or emergency type symptoms.   Medication list was reconciled, printed and provided to the patient in AVS. Patient instructions and summary information was reviewed with the patient as documented in the AVS. This note was  prepared with assistance of Dragon voice recognition software. Occasional wrong-word or sound-a-like substitutions may have occurred due to the inherent limitations of voice recognition software  This visit occurred during the SARS-CoV-2 public health emergency.  Safety protocols were in place, including screening questions prior to the visit, additional usage of staff PPE, and extensive cleaning of exam room while observing appropriate contact time as indicated for disinfecting solutions.

## 2020-09-13 NOTE — Patient Instructions (Signed)
Please follow up if symptoms do not improve or as needed.    Scabies, Adult  Scabies is a skin condition that happens when very small insects called mites get under the skin (infestation). This causes a rash and severe itchiness. Scabies is contagious, which means it can spread from person to person. If you get scabies, it is common for others in your household to get scabies too. With proper treatment, symptoms usually go away in 2-4 weeks. Scabies usually does not cause lasting problems. What are the causes? This condition is caused by tiny mites (Sarcoptes scabiei, or human itch mites) that can only be seen with a microscope. The mites get into the top layer of skin and lay eggs. Scabies can spread from person to person through:  Close contact with a person who has scabies.  Sharing or having contact with infested items, such as towels, bedding, or clothing. What increases the risk? The following factors may make you more likely to develop this condition:  Living in a nursing home or other extended care facility.  Having sexual contact with a partner who has scabies.  Caring for others who are at increased risk for scabies. What are the signs or symptoms? Symptoms of this condition include:  Severe itchiness. This is often worse at night.  A rash that includes tiny red bumps or blisters. The rash commonly occurs on the hands, wrists, elbows, armpits, chest, waist, groin, or buttocks. The bumps may form a line (burrow) in some areas.  Skin irritation. This can include scaly patches or sores. How is this diagnosed? This condition may be diagnosed based on:  A physical exam of the skin.  A skin test. Your health care provider may take a sample of your affected skin (skin scraping) and have it examined under a microscope for signs of mites. How is this treated? This condition may be treated with:  Medicated cream or lotion that kills the mites. This is spread on the entire body  and left on for several hours. Usually, one treatment with medicated cream or lotion is enough to kill all the mites. In severe cases, the treatment may need to be repeated.  Medicated cream that relieves itching.  Medicines taken by mouth (orally) that: ? Relieve itching. ? Reduce the swelling and redness. ? Kill the mites. This treatment may be done in severe cases. Follow these instructions at home: Medicines  Take or apply over-the-counter and prescription medicines only as told by your health care provider.  Apply medicated cream or lotion as told by your health care provider.  Do not wash off the medicated cream or lotion until the necessary amount of time has passed. Skin care  Avoid scratching the affected areas of your skin.  Keep your fingernails closely trimmed to reduce injury from scratching.  Take cool baths or apply cool washcloths to your skin to help reduce itching. General instructions  Clean all items that you had contact with during the 3 days before diagnosis. This includes bedding, clothing, towels, and furniture. Do this on the same day that you start treatment. ? Dry-clean items, or use hot water to wash items. Dry items on the hot dry cycle. ? Place items that cannot be washed into closed, airtight plastic bags for at least 3 days. The mites cannot live for more than 3 days away from human skin. ? Vacuum furniture and mattresses that you use.  Make sure that other people who may have been infested are examined by  a health care provider. These include members of your household and anyone who may have had contact with infested items.  Keep all follow-up visits. This is important. Where to find more information  Centers for Disease Control and Prevention: [employer] Contact a health care provider if:  You have itching that does not go away after 4 weeks of treatment.  You continue to develop new bumps or burrows.  You have redness, swelling, or pain in  your rash area after treatment.  You have fluid, blood, or pus coming from your rash. Summary  Scabies is a skin condition that causes a rash and severe itchiness.  This condition is caused by tiny mites that get into the top layer of the skin and lay eggs.  Scabies can spread from person to person.  Follow treatments as recommended by your health care provider.  Clean all items that you recently had contact with. This information is not intended to replace advice given to you by your health care provider. Make sure you discuss any questions you have with your health care provider. Document Revised: 08/21/2019 Document Reviewed: 08/21/2019 Elsevier Patient Education  2021 [employer].

## 2020-09-19 ENCOUNTER — Telehealth: Payer: Self-pay

## 2020-09-19 NOTE — Telephone Encounter (Signed)
Permethrine was ordered with 1 refill. She should call her pharmacy.

## 2020-09-19 NOTE — Telephone Encounter (Signed)
Please advise, medication not on list.. okay to order ?

## 2020-09-19 NOTE — Telephone Encounter (Signed)
Patient was seen by Dr. Mardelle Matte on 09/13/20.  Patient states Dr. Mardelle Matte informed her to call back in with the refills she needs.    Patient is requesting refill on permethrin 5%.  Patient uses [facility] in [place].

## 2020-09-20 NOTE — Telephone Encounter (Signed)
Left voicemail for patient to return call. Medication was ordered, patient needs to call pharmacy to follow up on this.

## 2020-09-21 NOTE — Telephone Encounter (Signed)
Patient called back and gave her the message below. Patient she would call her pharmacy.

## 2020-10-19 IMAGING — CT CT ABD-PELV W/ CM
2 of 4 series · 14 of 46 positions shown, 16 images · IV contrast (omnipaque)
Comparison: None.
COMPARISON: None.

Addendum:
CLINICAL DATA: Severe abdominal pain with nausea and vomiting this
morning. Abdominal distension.

EXAM:
CT ABDOMEN AND PELVIS WITH CONTRAST
TECHNIQUE: Multidetector CT imaging of the abdomen and pelvis was performed
using the standard protocol following bolus administration of
intravenous contrast.
CONTRAST:  100mL OMNIPAQUE IOHEXOL 300 MG/ML  SOLN

[Series 2: axial st · axial · 0.67mm/px · z∈[-472,-82]mm · 11 of 88 slices shown, 13 images]
[im 5/88  soft-tissue]
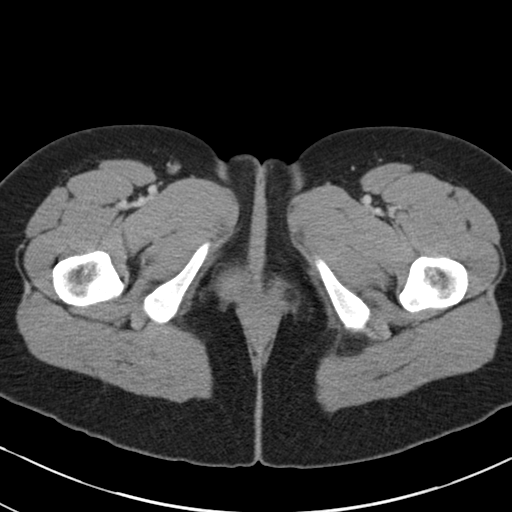
[im 5/88  bone]
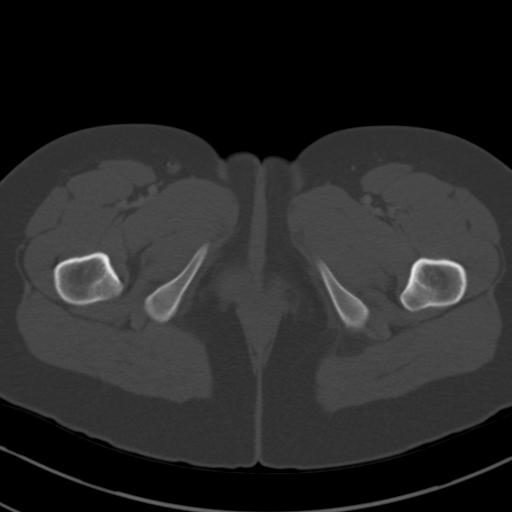
[im 15/88  soft-tissue]
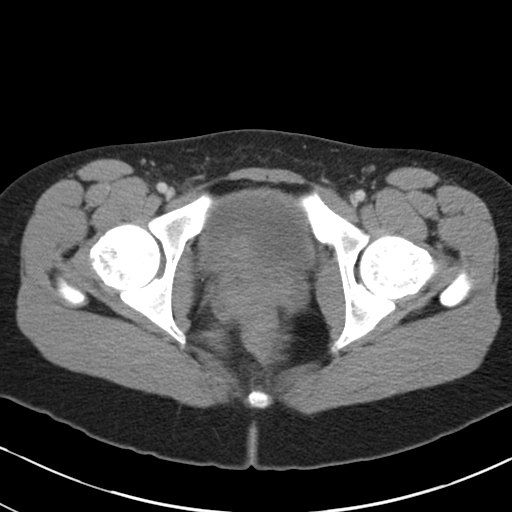
[im 20/88  soft-tissue]
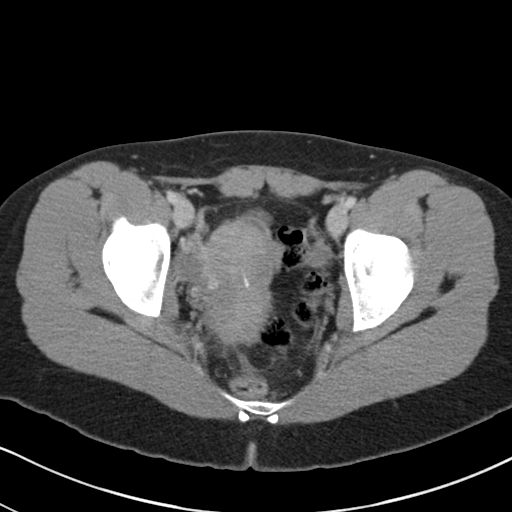
[im 30/88  soft-tissue]
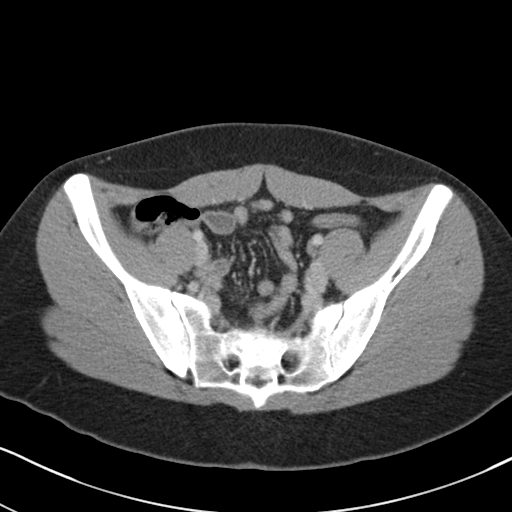
[im 34/88  soft-tissue]
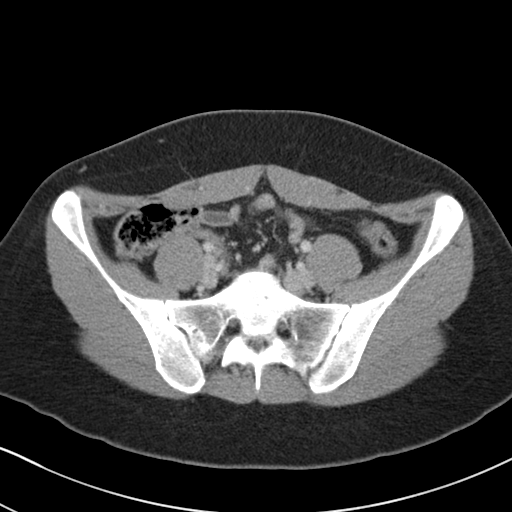
[im 44/88  soft-tissue]
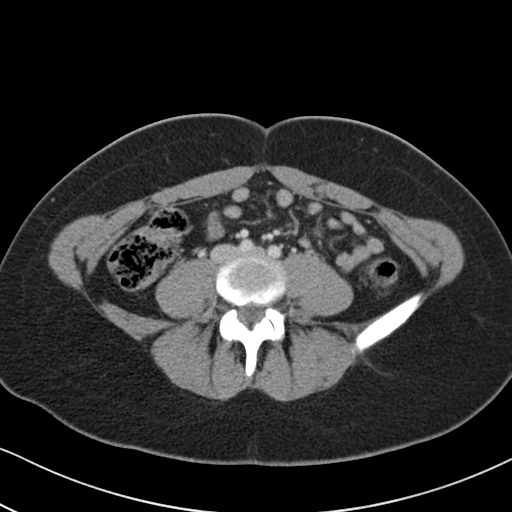
[im 54/88  soft-tissue]
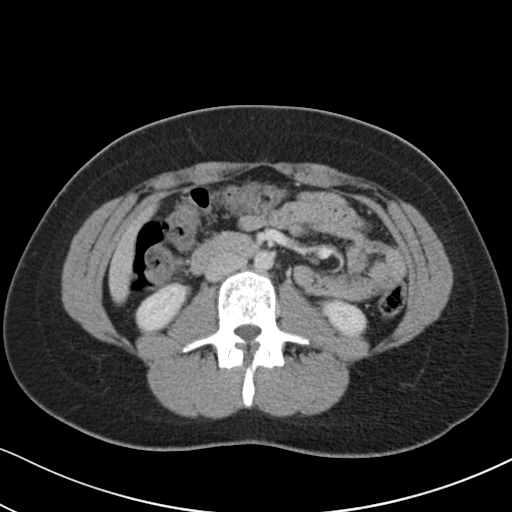
[im 59/88  soft-tissue]
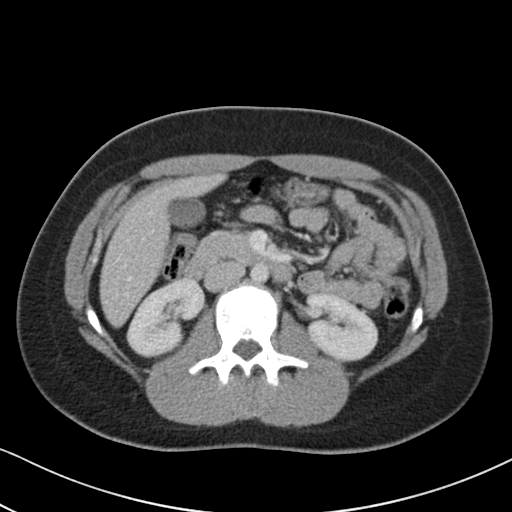
[im 68/88  soft-tissue]
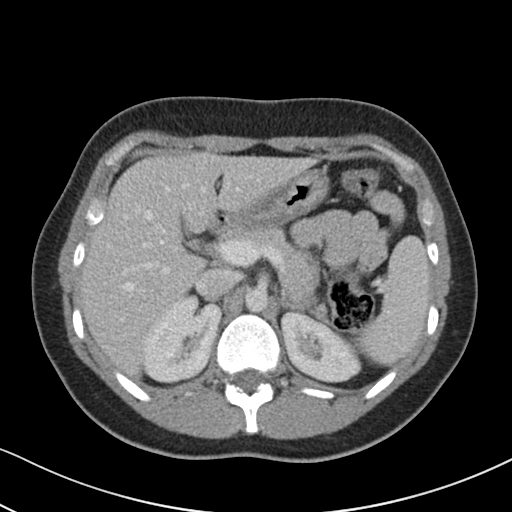
[im 68/88  bone]
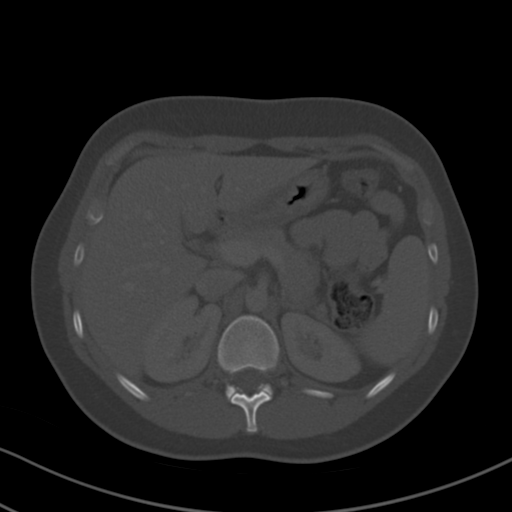
[im 73/88  soft-tissue]
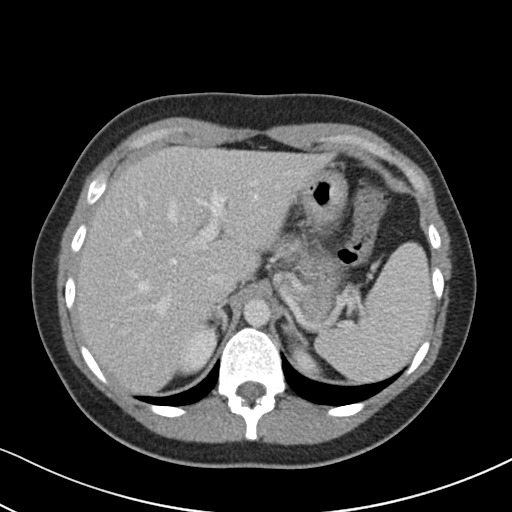
[im 83/88  soft-tissue]
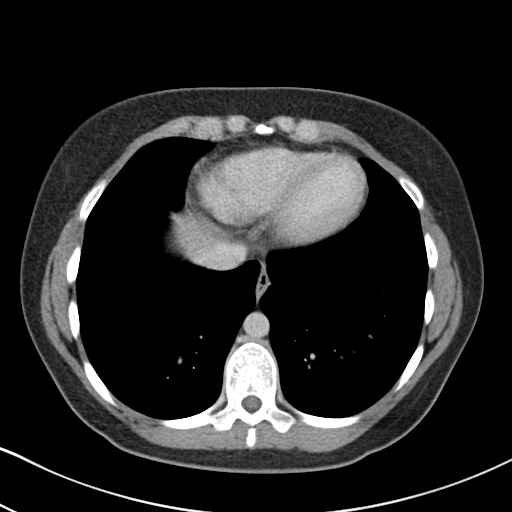

[Series 5: coronal st · coronal · 0.66mm/px · 3 of 128 slices shown]
[im 43/128  soft-tissue]
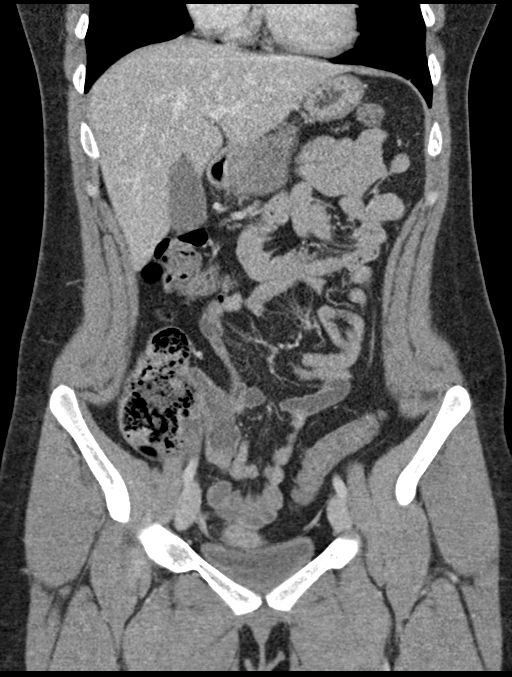
[im 57/128  soft-tissue]
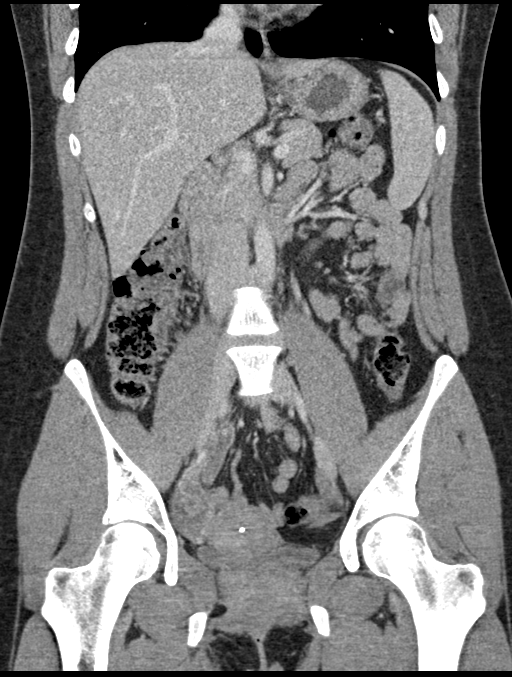
[im 71/128  soft-tissue]
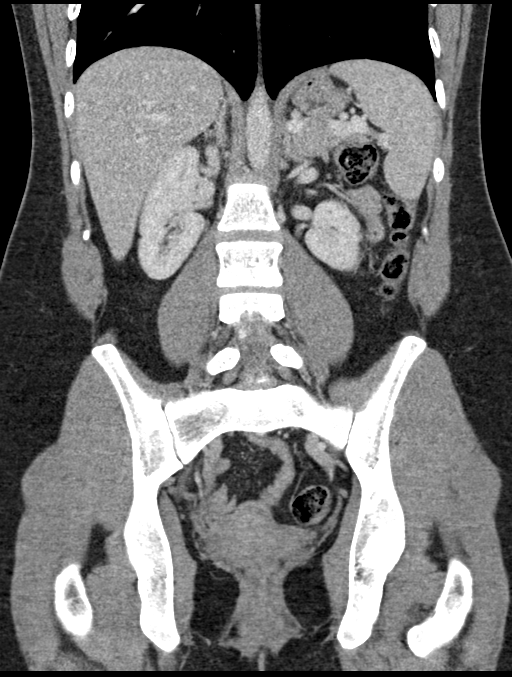

[14 of 46 positions shown; findings below may reference images not displayed]

FINDINGS: Lower chest: Normal.

Hepatobiliary: Normal.

Pancreas: Normal.

Spleen: Normal.

Adrenals/Urinary Tract: Normal.

Stomach/Bowel: Abnormal 10 x 11 mm diameter of a fluid-filled
appendix containing punctate appendicoliths in the right hemipelvis
medial to the iliac vessels, axial image 60 /coronal image
55/sagittal image 71. No perforation or pericolonic abscess, free
fluid or significant periappendiceal mesentery inflammation.
Moderate colonic stool burden without bowel obstruction. Mild
diverticulosis coli. No apparent gastric abnormality.

Vascular/Lymphatic: Moderate congenital mass effect of the right
common iliac artery on the left common iliac vein. No aorta aneurysm
or atherosclerosis. Patent portal and hepatic veins. No adenopathy.

Reproductive: Normally sized uterus. An intrauterine device in the
endometrial canal at the fundal level. A crenulated 1.8 cm right
ovarian benign functional cyst.

Other: No abdominal wall hernia.

Musculoskeletal: Small lower thoracic Schmorl's nodes.

ITHIL critical note in progress.
IMPRESSION: Abnormally dilated fluid-filled appendix containing punctate
appendicoliths without periappendiceal mesenteric inflammation, free
fluid, perforation or abscess. Findings would support a clinical
diagnosis of an early uncomplicated acute appendicitis.

A benign functional right ovarian small crenulated cyst.

ADDENDUM:
I spoke to Dr. Vl regarding the critical imaging finding at [DATE]
hours EST 07/08/19 by telephone.

*** End of Addendum ***
FINDINGS: Lower chest: Normal.

Hepatobiliary: Normal.

Pancreas: Normal.

Spleen: Normal.

Adrenals/Urinary Tract: Normal.

Stomach/Bowel: Abnormal 10 x 11 mm diameter of a fluid-filled
appendix containing punctate appendicoliths in the right hemipelvis
medial to the iliac vessels, axial image 60 /coronal image
55/sagittal image 71. No perforation or pericolonic abscess, free
fluid or significant periappendiceal mesentery inflammation.
Moderate colonic stool burden without bowel obstruction. Mild
diverticulosis coli. No apparent gastric abnormality.

Vascular/Lymphatic: Moderate congenital mass effect of the right
common iliac artery on the left common iliac vein. No aorta aneurysm
or atherosclerosis. Patent portal and hepatic veins. No adenopathy.

Reproductive: Normally sized uterus. An intrauterine device in the
endometrial canal at the fundal level. A crenulated 1.8 cm right
ovarian benign functional cyst.

Other: No abdominal wall hernia.

Musculoskeletal: Small lower thoracic Schmorl's nodes.

ITHIL critical note in progress.
IMPRESSION: Abnormally dilated fluid-filled appendix containing punctate
appendicoliths without periappendiceal mesenteric inflammation, free
fluid, perforation or abscess. Findings would support a clinical
diagnosis of an early uncomplicated acute appendicitis.

A benign functional right ovarian small crenulated cyst.

## 2021-07-24 NOTE — Progress Notes (Signed)
26 y.o. G0P0000 Single White or Caucasian Not Hispanic or Latino female here for annual exam.  She has a mirena IUD, placed in 4/19. No cycles.  ?Sexually active, same partner x 6 months. Not using condoms.  ? ?She c/o a couple of week h/o an increase in clear vaginal d/c and a slight odor.  ? ?She reports a couple month h/o a random pain in her RLQ. Last for a few seconds about 1 x a week, ~2/10 in severity.  ? ?She was having issues with diarrhea/constipation, helped with a probiotic.  ?  ? ?No LMP recorded. (Menstrual status: IUD).          ?Sexually active: Yes.    ?The current method of family planning is IUD.    ?Exercising: Yes.    Gym/ health club routine includes walking, weights. ?Smoker:  no ? ?Health Maintenance: ?Pap:  07-29-17 normal ?History of abnormal Pap:  no ?MMG:  N/A ?BMD:   N/A ?Colonoscopy: N/A ?TDaP:  2015 ?Gardasil: no ? ? reports that she has never smoked. She has never used smokeless tobacco. She reports current alcohol use. She reports that she does not use drugs. Social ETOH. 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 Finished college last year will go back to school for her [employer].  ? ?Past Medical History:  ?Diagnosis Date  ? Depression   ? History of chlamydia   ? ? ?Past Surgical History:  ?Procedure Laterality Date  ? INTRAUTERINE DEVICE (IUD) INSERTION  09/03/2017  ? Mirena   ? LAPAROSCOPIC APPENDECTOMY N/A 07/08/2019  ? Procedure: APPENDECTOMY LAPAROSCOPIC;  Surgeon: Abigail Miyamoto, MD;  Location: [facility];  Service: General;  Laterality: N/A;  ? TONSILLECTOMY  04/2017  ? ? ?Current Outpatient Medications  ?Medication Sig Dispense Refill  ? levonorgestrel (MIRENA) 20 MCG/24HR IUD 1 each by Intrauterine route once.    ? Multiple Vitamin (MULTIVITAMIN PO) Take by mouth.    ? Probiotic Product (PROBIOTIC PO) Take by mouth.    ? ?No current facility-administered medications for this visit.  ? ? ?Family History  ?Problem Relation Age of Onset  ? Deep vein thrombosis Father 25  ? Breast cancer  Maternal Grandmother   ? ? ?Review of Systems  ?Genitourinary:   ?     Vaginal discharge and occas.RLQ pain  ? ?Exam:   ?BP 120/76 (BP Location: Right Arm, Patient Position: Sitting, Cuff Size: Normal)   Pulse 84   Ht 5\' 5"  (1.651 m)   Wt 188 lb (85.3 kg)   SpO2 100%   BMI 31.28 kg/m?   Weight change: @WEIGHTCHANGE @ Height:   Height: 5\' 5"  (165.1 cm)  ?Ht Readings from Last 3 Encounters:  ?07/28/21 5\' 5"  (1.651 m)  ?09/13/20 5\' 5"  (1.651 m)  ?08/25/20 5\' 5"  (1.651 m)  ? ? ?General appearance: alert, cooperative and appears stated age ?Head: Normocephalic, without obvious abnormality, atraumatic ?Neck: no adenopathy, supple, symmetrical, trachea midline and thyroid normal to inspection and palpation ?Lungs: clear to auscultation bilaterally ?Cardiovascular: regular rate and rhythm ?Breasts: normal appearance, no masses or tenderness ?Abdomen: soft, non-tender; non distended,  no masses,  no organomegaly ?Extremities: extremities normal, atraumatic, no cyanosis or edema ?Skin: Skin color, texture, turgor normal. No rashes or lesions ?Lymph nodes: Cervical, supraclavicular, and axillary nodes normal. ?No abnormal inguinal nodes palpated ?Neurologic: Grossly normal ? ? ?Pelvic: External genitalia:  no lesions ?             Urethra:  normal appearing urethra with no masses,  tenderness or lesions ?             Bartholins and Skenes: normal    ?             Vagina: normal appearing vagina with normal color and discharge, no lesions ?             Cervix: no lesions and IUD string 2 cm ?              ?Bimanual Exam:  Uterus:  normal size, contour, position, consistency, mobility, non-tender and anteverted ?             Adnexa: no mass, fullness, tenderness ?              Rectovaginal: Confirms ?              Anus:  normal sphincter tone, no lesions ? ?Bari Mantis chaperoned for the exam. ? ?1. Well woman exam ?Discussed breast self exam ?Discussed calcium and vit D intake ? ?2. Screening for cervical cancer ?- Cytology -  PAP ? ?3. Screening examination for STD (sexually transmitted disease) ?- RPR ?- HIV Antibody (routine testing w rflx) ?- Hepatitis C antibody ?- HSV(herpes simplex vrs) 1+2 ab-IgG ?- SureSwab? Advanced Vaginitis Plus,TMA ? ?4. Laboratory exam ordered as part of routine general medical examination ?- CBC ?- Comprehensive metabolic panel ?- Lipid panel ? ?5. IUD check up ?Doing well ? ?6. Acute vaginitis ?- WET PREP FOR TRICH, YEAST, CLUE: negative ?- SureSwab? Advanced Vaginitis Plus,TMA ? ?7. H/O cold sores ?- valACYclovir (VALTREX) 1000 MG tablet; Take 2 tablets po prn cold sore, repeat x one 12 hours later.  Dispense: 30 tablet; Refill: 1 ?  ?

## 2021-07-28 ENCOUNTER — Other Ambulatory Visit (HOSPITAL_COMMUNITY)
Admission: RE | Admit: 2021-07-28 | Discharge: 2021-07-28 | Disposition: A | Discharge status: Home / Self Care | Payer: BC Managed Care – PPO | Source: Ambulatory Visit | Attending: Obstetrics and Gynecology | Admitting: Obstetrics and Gynecology

## 2021-07-28 ENCOUNTER — Ambulatory Visit (INDEPENDENT_AMBULATORY_CARE_PROVIDER_SITE_OTHER): Payer: BC Managed Care – PPO | Admitting: Obstetrics and Gynecology

## 2021-07-28 ENCOUNTER — Other Ambulatory Visit: Payer: Self-pay

## 2021-07-28 ENCOUNTER — Encounter: Payer: Self-pay | Admitting: Obstetrics and Gynecology

## 2021-07-28 VITALS — BP 120/76 | HR 84 | Ht 65.0 in | Wt 188.0 lb

## 2021-07-28 DIAGNOSIS — N760 Acute vaginitis: Secondary | ICD-10-CM

## 2021-07-28 DIAGNOSIS — Z0000 Encounter for general adult medical examination without abnormal findings: Secondary | ICD-10-CM | POA: Diagnosis not present

## 2021-07-28 DIAGNOSIS — Z30431 Encounter for routine checking of intrauterine contraceptive device: Secondary | ICD-10-CM

## 2021-07-28 DIAGNOSIS — Z113 Encounter for screening for infections with a predominantly sexual mode of transmission: Secondary | ICD-10-CM

## 2021-07-28 DIAGNOSIS — Z01419 Encounter for gynecological examination (general) (routine) without abnormal findings: Secondary | ICD-10-CM | POA: Diagnosis not present

## 2021-07-28 DIAGNOSIS — Z124 Encounter for screening for malignant neoplasm of cervix: Secondary | ICD-10-CM

## 2021-07-28 DIAGNOSIS — Z8619 Personal history of other infectious and parasitic diseases: Secondary | ICD-10-CM

## 2021-07-28 LAB — WET PREP FOR TRICH, YEAST, CLUE

## 2021-07-28 MED ORDER — VALACYCLOVIR HCL 1 G PO TABS: ORAL_TABLET | ORAL | 1 refills | Status: DC

## 2021-07-28 NOTE — Patient Instructions (Signed)

## 2021-07-31 LAB — COMPREHENSIVE METABOLIC PANEL
AG Ratio: 1.7 (calc) (ref 1.0–2.5)
ALT: 10 U/L (ref 6–29)
AST: 11 U/L (ref 10–30)
Albumin: 4.3 g/dL (ref 3.6–5.1)
Alkaline phosphatase (APISO): 44 U/L (ref 31–125)
BUN/Creatinine Ratio: 12 (calc) (ref 6–22)
BUN: 12 mg/dL (ref 7–25)
CO2: 26 mmol/L (ref 20–32)
Calcium: 9.8 mg/dL (ref 8.6–10.2)
Chloride: 106 mmol/L (ref 98–110)
Creat: 1.01 mg/dL — ABNORMAL HIGH (ref 0.50–0.96)
Globulin: 2.6 g/dL (calc) (ref 1.9–3.7)
Glucose, Bld: 81 mg/dL (ref 65–99)
Potassium: 4.1 mmol/L (ref 3.5–5.3)
Sodium: 140 mmol/L (ref 135–146)
Total Bilirubin: 0.5 mg/dL (ref 0.2–1.2)
Total Protein: 6.9 g/dL (ref 6.1–8.1)

## 2021-07-31 LAB — LIPID PANEL
Cholesterol: 164 mg/dL (ref <200)
HDL: 59 mg/dL (ref >=50)
LDL Cholesterol (Calc): 91 mg/dL (calc)
Non-HDL Cholesterol (Calc): 105 mg/dL (calc) (ref <130)
Total CHOL/HDL Ratio: 2.8 (calc) (ref <5.0)
Triglycerides: 53 mg/dL (ref <150)

## 2021-07-31 LAB — HEPATITIS C ANTIBODY
Hepatitis C Ab: NONREACTIVE
SIGNAL TO CUT-OFF: 0.11 (ref <1.00)

## 2021-07-31 LAB — SURESWAB® ADVANCED VAGINITIS PLUS,TMA
C. trachomatis RNA, TMA: NOT DETECTED
CANDIDA SPECIES: NOT DETECTED
Candida glabrata: NOT DETECTED
N. gonorrhoeae RNA, TMA: NOT DETECTED
SURESWAB(R) ADV BACTERIAL VAGINOSIS(BV),TMA: POSITIVE — AB
TRICHOMONAS VAGINALIS (TV),TMA: NOT DETECTED

## 2021-07-31 LAB — CBC
HCT: 37.6 % (ref 35.0–45.0)
Hemoglobin: 12.5 g/dL (ref 11.7–15.5)
MCH: 30.5 pg (ref 27.0–33.0)
MCHC: 33.2 g/dL (ref 32.0–36.0)
MCV: 91.7 fL (ref 80.0–100.0)
MPV: 10.1 fL (ref 7.5–12.5)
Platelets: 328 10*3/uL (ref 140–400)
RBC: 4.1 10*6/uL (ref 3.80–5.10)
RDW: 11.9 % (ref 11.0–15.0)
WBC: 7.3 10*3/uL (ref 3.8–10.8)

## 2021-07-31 LAB — RPR: RPR Ser Ql: NONREACTIVE

## 2021-07-31 LAB — HSV(HERPES SIMPLEX VRS) I + II AB-IGG
HAV 1 IGG,TYPE SPECIFIC AB: 27 index — ABNORMAL HIGH
HSV 2 IGG,TYPE SPECIFIC AB: 3.27 index — ABNORMAL HIGH

## 2021-07-31 LAB — HIV ANTIBODY (ROUTINE TESTING W REFLEX): HIV 1&2 Ab, 4th Generation: NONREACTIVE

## 2021-08-01 ENCOUNTER — Telehealth: Payer: Self-pay | Admitting: *Deleted

## 2021-08-01 DIAGNOSIS — Z8619 Personal history of other infectious and parasitic diseases: Secondary | ICD-10-CM

## 2021-08-01 DIAGNOSIS — N760 Acute vaginitis: Secondary | ICD-10-CM

## 2021-08-01 LAB — CYTOLOGY - PAP
Comment: NEGATIVE
Diagnosis: UNDETERMINED — AB
High risk HPV: NEGATIVE

## 2021-08-01 NOTE — Telephone Encounter (Signed)
Message left to return call to Kirin Brandenburger at [phone].   

## 2021-08-01 NOTE — Telephone Encounter (Signed)
-----   Message from Salvadore Dom, MD sent at 08/01/2021  1:29 PM EDT ----- ?Please inform the patient that her vaginitis panel returned positive for bacterial vaginitis and call in treatment with flagyl. Options are: 1)oral flagyl, 500 mg bid x 7 days, or 2) Metrogel, one applicator full qhs x 5 days. No ETOH while taking flagyl or for 24 hours after completing it.  ?Her creatinine was just over normal, this can often occur with dehydration. If she doesn't feel she was dehydrated please have her return for a renal panel with a GFR.  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 Offer her an appointment to discuss the HSV 2.  ?Her pap returned with ASCUS, but negative HPV. F/U is 3 years.  ?The rest of her lab work was normal.  ?

## 2021-08-01 NOTE — Progress Notes (Signed)
LM for patient to call back to review results.

## 2021-08-04 MED ORDER — METRONIDAZOLE 0.75 % VA GEL: 1.0000 | Freq: Every day | VAGINAL | 0 refills | Status: AC

## 2021-08-04 MED ORDER — VALACYCLOVIR HCL 1 G PO TABS: ORAL_TABLET | ORAL | 1 refills | Status: DC

## 2021-08-04 NOTE — Telephone Encounter (Signed)
FYI. Pt notified and voiced understanding. Will send a msg to scheduling for appt. Pt states she would wish to discuss the HSV 2 and also have GC/CT done at appt if possible to finish complete STD check up and will also plan to have renal function w/ GFR at that time as well due to that she did not feel dehydrated at time of last blood test.  ?

## 2021-08-04 NOTE — Telephone Encounter (Signed)
Pt called and left msg re: test results.  ? ?Called pt back and no answer, left VM to call back.  ?

## 2021-08-08 NOTE — Telephone Encounter (Signed)
I noticed that nothing has been scheduled so I called pt and left detailed VM on machine per DPR re: f/u with her and her desire for another OV w/ Dr. Oscar La.  ?

## 2021-08-09 NOTE — Telephone Encounter (Signed)
Scheduled 08/18/21

## 2021-08-15 NOTE — Progress Notes (Signed)
GYNECOLOGY  VISIT ?  ?HPI: ?26 y.o.   Single White or Caucasian Not Hispanic or Latino  female   ?G0P0000 with No LMP recorded. (Menstrual status: IUD).   ?here for Discuss HSV ,STD check  ? ?STD testing from last month returned with + HSV 1 & 2 serology. ? ?She has a h/o cold sores, only gets them if she is sick which is rare.  ? ?She has a mirena IUD, placed in 4/19.  ? ?GYNECOLOGIC HISTORY: ?No LMP recorded. (Menstrual status: IUD). ?Contraception:Mirena inserted 08-2017 ?Menopausal hormone therapy: N/A ?       ?OB History   ? ? Gravida  ?0  ? Para  ?0  ? Term  ?0  ? Preterm  ?0  ? AB  ?0  ? Living  ?0  ?  ? ? SAB  ?0  ? IAB  ?0  ? Ectopic  ?0  ? Multiple  ?0  ? Live Births  ?0  ?   ?  ?  ?    ? ?Patient Active Problem List  ? Diagnosis Date Noted  ? Depression, major, single episode, mild (HCC) 09/14/2019  ? History of chlamydia   ? ? ?Past Medical History:  ?Diagnosis Date  ? Depression   ? History of chlamydia   ? ? ?Past Surgical History:  ?Procedure Laterality Date  ? INTRAUTERINE DEVICE (IUD) INSERTION  09/03/2017  ? Mirena   ? LAPAROSCOPIC APPENDECTOMY N/A 07/08/2019  ? Procedure: APPENDECTOMY LAPAROSCOPIC;  Surgeon: Abigail Miyamoto, MD;  Location: [facility];  Service: General;  Laterality: N/A;  ? TONSILLECTOMY  04/2017  ? ? ?Current Outpatient Medications  ?Medication Sig Dispense Refill  ? levonorgestrel (MIRENA) 20 MCG/24HR IUD 1 each by Intrauterine route once.    ? Multiple Vitamin (MULTIVITAMIN PO) Take by mouth.    ? Probiotic Product (PROBIOTIC PO) Take by mouth.    ? valACYclovir (VALTREX) 1000 MG tablet Take 2 tablets po prn cold sore, repeat x one 12 hours later. 30 tablet 1  ? ?No current facility-administered medications for this visit.  ?  ? ?ALLERGIES: Patient has no known allergies. ? ?Family History  ?Problem Relation Age of Onset  ? Deep vein thrombosis Father 62  ? Breast cancer Maternal Grandmother   ? ? ?Social History  ? ?Socioeconomic History  ? Marital status: Single  ?  Spouse name:  Not on file  ? Number of children: Not on file  ? Years of education: Not on file  ? Highest education level: Not on file  ?Occupational History  ? Not on file  ?Tobacco Use  ? Smoking status: Never  ? Smokeless tobacco: Never  ?Vaping Use  ? Vaping Use: Never used  ?Substance and Sexual Activity  ? Alcohol use: Yes  ?  Comment: Social   ? Drug use: No  ? Sexual activity: Yes  ?  Partners: Male  ?  Birth control/protection: I.U.D.  ?  Comment: Mirena 09-03-17   ?Other Topics Concern  ? Not on file  ?Social History Narrative  ? Not on file  ? ?Social Determinants of Health  ? ?Financial Resource Strain: Not on file  ?Food Insecurity: Not on file  ?Transportation Needs: Not on file  ?Physical Activity: Not on file  ?Stress: Not on file  ?Social Connections: Not on file  ?Intimate Partner Violence: Not on file  ? ? ?ROS ? ?PHYSICAL EXAMINATION:   ? ?There were no vitals taken for this visit.    ?General  appearance: alert, cooperative and appears stated age ?  ?1. 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 No h/o genital HSV.  ?-We discussed possible false + ?-Discussed that it is possible to have HSV and never have an outbreak ?-Discussed HSV and pregnancy/delivery ?-Discussed the risk of asymptomatic shedding  ? ?2. Elevated serum creatinine ?She is well hydrated this morning ?- Creatinine with Est GFR ? ?3. General counseling and advice on female contraception ?She had concerns about possible risks from leaving the mirena in for 8 years over 5. Reassured her that longer use of the IUD doesn't increase her risk of fertility issues. We discussed that getting a STD with the IUD could possibly lead to a worse infection, but that an STD without an IUD can lead to fertility issues.  ? ?~20 minutes was spent in total patient care.  ?

## 2021-08-18 ENCOUNTER — Ambulatory Visit: Payer: BC Managed Care – PPO | Admitting: Obstetrics and Gynecology

## 2021-08-18 ENCOUNTER — Encounter: Payer: Self-pay | Admitting: Obstetrics and Gynecology

## 2021-08-18 VITALS — BP 118/74

## 2021-08-18 DIAGNOSIS — B009 Herpesviral infection, unspecified: Secondary | ICD-10-CM | POA: Diagnosis not present

## 2021-08-18 DIAGNOSIS — R7989 Other specified abnormal findings of blood chemistry: Secondary | ICD-10-CM

## 2021-08-18 DIAGNOSIS — Z3009 Encounter for other general counseling and advice on contraception: Secondary | ICD-10-CM | POA: Diagnosis not present

## 2021-08-18 NOTE — Patient Instructions (Signed)
Cold Sore ? ?A cold sore, also called a fever blister, is a small, fluid-filled sore that forms inside the mouth or on the lips, gums, nose, chin, or cheeks. Cold sores can spread to other parts of the body, such as the eyes, fingers, or genitals. ?Cold sores can spread from person to person (are contagious) until the sores crust over completely. Most cold sores go away within 2 weeks. ?What are the causes? ?Cold sores are caused by an infection from a common type of herpes simplex virus (HSV-1). HSV-1 is closely related to the HSV-2virus, which is the virus that causes genital herpes, but these viruses are not the same. Once a person is infected with HSV-1, the virus remains permanently in the body. ?HSV-1 is spread from person to person through close contact, such as through kissing, touching the affected area, or sharing personal items such as lip balm, razors, a drinking glass, or eating utensils. ?What increases the risk? ?You are more likely to develop this condition if you: ?Are tired, stressed, or sick. ?Are menstruating. ?Are pregnant. ?Take certain medicines. ?Are exposed to cold weather or too much sun. ?What are the signs or symptoms? ?Symptoms of a cold sore outbreak go through different stages. These are the stages of a cold sore: ?Tingling, itching, or burning is felt 1-2 days before the outbreak. ?Fluid-filled blisters appear on the lips, inside the mouth, on the nose, or on the cheeks. ?The blisters start to ooze clear fluid. ?The blisters dry up, and a yellow crust appears in their place. ?The crust falls off. ?In some cases, other symptoms can develop during a cold sore outbreak. These can include: ?Fever. ?Sore throat. ?Headache. ?Muscle aches. ?Swollen neck glands. ?How is this diagnosed? ?This condition is diagnosed based on your medical history and a physical exam. Your health care provider may do a blood test or may swab some fluid from your sore and then examine the swab in the lab. ?How is  this treated? ?There is no cure for cold sores or HSV-1. There is also no vaccine for HSV-1. Most cold sores go away on their own without treatment within 2 weeks. Medicines cannot make the infection go away, but your health care provider may prescribe medicines to: ?Help relieve some of the pain associated with the sores. ?Work to stop the virus from multiplying. ?Shorten healing time. ?Medicines may be in the form of creams, gels, pills, or a shot. ?Follow these instructions at home: ?Medicines ?Take or apply over-the-counter and prescription medicines only as told by your health care provider. ?Use a cotton-tip swab to apply creams or gels to your sores. ?Ask your health care provider if you can take lysine supplements. Research has found that lysine may help heal the cold sore faster and prevent outbreaks. ?Sore care ? ?Do not touch the sores or pick the scabs. ?Wash your hands often with soap and water for at least 20 seconds. Do not touch your eyes without washing your hands first. ?Keep the sores clean and dry. ?If directed, put ice on the sores. To do this: ?Put ice in a plastic bag. ?Place a towel between your skin and the bag. ?Leave the ice on for 20 minutes, 2-3 times a day. ?Remove the ice if your skin turns bright red. This is very important. If you cannot feel pain, heat, or cold, you have a greater risk of damage to the area. ?Eating and drinking ?Eat a soft, bland diet. Avoid eating hot, cold, or salty foods. ?  Use a straw if it hurts to drink out of a glass. ?Eat foods that are rich in lysine, such as meat, fish, and dairy products. ?Avoid sugary foods, chocolates, nuts, and grains. These foods are rich in a nutrient called arginine, which can cause the virus to multiply. ?Lifestyle ?Do not kiss, have oral sex, or share personal items until your sores heal. ?Stress, poor sleep, and being out in the sun can trigger outbreaks. Make sure you: ?Do activities that help you relax, such as deep breathing  exercises or meditation. ?Get enough sleep. ?Apply sunscreen on your lips before you go out in the sun. ?Contact a health care provider if: ?You have symptoms for more than 2 weeks. ?You have pus coming from the sores. ?You have redness that is spreading. ?You have pain or irritation in your eye. ?You get sores on your genitals. ?Your sores do not heal within 2 weeks. ?You have frequent cold sore outbreaks. ?Get help right away if: ?You have a fever and your symptoms suddenly get worse. ?You have a headache and confusion. ?You have tiredness (fatigue) or loss of appetite. ?You have a stiff neck or sensitivity to light. ?Summary ?A cold sore, also called a fever blister, is a small, fluid-filled sore that forms inside the mouth or on the lips, gums, nose, chin, or cheeks. ?Most cold sores go away on their own without treatment within 2 weeks. Your health care provider may prescribe medicines to help relieve some of the pain, work to stop the virus from multiplying, and shorten healing time. ?Wash your hands often with soap and water for at least 20 seconds. Do not touch your eyes without washing your hands first. ?Do not kiss, have oral sex, or share personal items until your sores heal. ?Contact a health care provider if your sores do not heal within 2 weeks. ?This information is not intended to replace advice given to you by your health care provider. Make sure you discuss any questions you have with your health care provider. ?Document Revised: 02/01/2021 Document Reviewed: 02/01/2021 ?Elsevier Patient Education ? 2023 [employer] ?Genital Herpes ?Genital herpes is a common sexually transmitted infection (STI) that is caused by a virus. The virus spreads from person to person through contact with a sore, infected saliva, or infected skin. The virus can cause itching, blisters, and sores around the genitals or rectum. During an outbreak of infection, symptoms may last for several days and then go away. However,  the virus remains in the body, so more outbreaks may happen in the future. The time between outbreaks varies and can be from months to years. ?Genital herpes can affect anyone. It is particularly concerning for pregnant women because the virus can be passed to the baby during delivery. Genital herpes is also a concern for people who have a weak disease-fighting system (immune system). ?What are the causes? ?This condition is caused by the herpes simplex virus, type 1 or type 2 (HSV-1 or HSV-2). The virus may spread through: ?Sexual contact with an infected person, including vaginal, anal, and oral sex. ?Contact with a herpes sore. ?The skin. This means that you can get herpes from an infected partner even if there are no blisters or sores present. Your partner may not know that he or she is infected. ?What increases the risk? ?You are more likely to develop this condition if: ?You have sex with many partners. ?You do not use latex or polyurethane condoms during sex. ?What are the signs or symptoms? ?  Most people do not have symptoms or they have mild symptoms that may be mistaken for other skin problems. Symptoms may include: ?Small, red bumps near the genitals, rectum, or mouth. These bumps turn into blisters and then sores. ?Flu-like (influenza-like) symptoms, including: ?Fever. ?Body aches. ?Swollen lymph nodes. ?Headache. ?Painful urination. ?Pain and itching in the genital area or rectal area. ?Vaginal discharge. ?Tingling or shooting pain in the legs and buttocks. ?Generally, symptoms are more severe and last longer during the first (primary) outbreak. Influenza-like symptoms are also more common during the primary outbreak. ?How is this diagnosed? ?This condition may be diagnosed based on: ?A physical exam. ?Your medical history. ?Blood tests. ?A test of a fluid sample (culture) from an open sore. ?How is this treated? ?There is no cure for this condition, but treatment with antiviral medicines can do the  following: ?Speed up healing and relieve symptoms. ?Help to reduce the spread of the virus to sexual partners. ?Limit the chance of future outbreaks, or make future outbreaks shorter. ?Lessen symptoms of fu

## 2021-08-19 LAB — CREATININE WITH EST GFR
Creat: 1.12 mg/dL — ABNORMAL HIGH (ref 0.50–0.96)
eGFR: 70 mL/min/{1.73_m2} (ref >=60)

## 2021-10-24 ENCOUNTER — Ambulatory Visit: Payer: BC Managed Care – PPO | Admitting: Physician Assistant

## 2021-10-24 ENCOUNTER — Encounter: Payer: Self-pay | Admitting: Physician Assistant

## 2021-10-24 VITALS — BP 100/70 | HR 86 | Temp 98.4°F | Ht 65.0 in | Wt 174.2 lb

## 2021-10-24 DIAGNOSIS — R5381 Other malaise: Secondary | ICD-10-CM

## 2021-10-24 DIAGNOSIS — R5383 Other fatigue: Secondary | ICD-10-CM

## 2021-10-24 DIAGNOSIS — R591 Generalized enlarged lymph nodes: Secondary | ICD-10-CM

## 2021-10-24 LAB — POCT URINE PREGNANCY: Preg Test, Ur: NEGATIVE

## 2021-10-24 NOTE — Progress Notes (Signed)
Jacqueline Giles is a 26 y.o. female here for a follow up of a pre-existing problem.  History of Present Illness:   Chief Complaint  Patient presents with   Adenopathy    Pt c/o swollen lymph nodes both axilla's x 1 week, sore. Also having fatigue and nausea, no vomiting, no fever or chills, some body aches. Taking Ibuprofen as needed.    HPI  Swollen Lymph Nodes  Patient presents with c/o of swollen lymph nodes for 1 week. Located on bilateral axilla. States she was also experiencing some nausea during this time. This has subsided. States her left axilla is more sore than her right, but this can fluctuate. Per pt, she has had sick contacts with her friend that was positive for strep -- she denies sore throat. She has been feeling increased fatigue and has noticed weight loss -- thinks she has lost about 15 pounds in last 2 months. No reported fever or chills. Denies cough, night sweats, sore throat or recent travel. Denies nipple discharge. No other known lymph nodes. No concern for Pregnancy.    Past Medical History:  Diagnosis Date   Depression    History of chlamydia      Social History   Tobacco Use   Smoking status: Never   Smokeless tobacco: Never  Vaping Use   Vaping Use: Never used  Substance Use Topics   Alcohol use: Yes    Comment: Social    Drug use: No    Past Surgical History:  Procedure Laterality Date   INTRAUTERINE DEVICE (IUD) INSERTION  09/03/2017   Mirena    LAPAROSCOPIC APPENDECTOMY N/A 07/08/2019   Procedure: APPENDECTOMY LAPAROSCOPIC;  Surgeon: Abigail Miyamoto, MD;  Location: [facility];  Service: General;  Laterality: N/A;   TONSILLECTOMY  04/2017    Family History  Problem Relation Age of Onset   Deep vein thrombosis Father 48   Breast cancer Maternal Grandmother     No Known Allergies  Current Medications:   Current Outpatient Medications:    levonorgestrel (MIRENA) 20 MCG/24HR IUD, 1 each by Intrauterine route once., Disp: , Rfl:    Multiple  Vitamin (MULTIVITAMIN PO), Take by mouth., Disp: , Rfl:    valACYclovir (VALTREX) 1000 MG tablet, Take 2 tablets po prn cold sore, repeat x one 12 hours later., Disp: 30 tablet, Rfl: 1   Review of Systems:   ROS Negative unless otherwise specified per HPI.   Vitals:   Vitals:   10/24/21 1526  BP: 100/70  Pulse: 86  Temp: 98.4 F (36.9 C)  TempSrc: Temporal  SpO2: 98%  Weight: 174 lb 4 oz (79 kg)  Height: 5\' 5"  (1.651 m)     Body mass index is 29 kg/m.  Physical Exam:   Physical Exam Vitals and nursing note reviewed.  Constitutional:      General: She is not in acute distress.    Appearance: She is well-developed. She is not ill-appearing or toxic-appearing.  Cardiovascular:     Rate and Rhythm: Normal rate and regular rhythm.     Pulses: Normal pulses.     Heart sounds: Normal heart sounds, S1 normal and S2 normal.  Pulmonary:     Effort: Pulmonary effort is normal.     Breath sounds: Normal breath sounds.  Lymphadenopathy:     Upper Body:     Right upper body: Axillary adenopathy present.     Left upper body: Axillary adenopathy present.  Skin:    General: Skin is warm and dry.  Neurological:     Mental Status: She is alert.     GCS: GCS eye subscore is 4. GCS verbal subscore is 5. GCS motor subscore is 6.  Psychiatric:        Speech: Speech normal.        Behavior: Behavior normal. Behavior is cooperative.    Results for orders placed or performed in visit on 10/24/21  POCT urine pregnancy  Result Value Ref Range   Preg Test, Ur Negative Negative    Assessment and Plan:   Malaise and fatigue Unclear etiology Urine test is negative Suspect viral illness -- recommend rest, hydration and continued monitoring of symptoms Update blood work today to assess for further cause  Lymphadenopathy Discussed that most lymph nodes are reactive in nature, however, given her systemic symptoms, will pursue imaging in the next 2 weeks if symptoms persist and their is  no clear cause of her symptoms  I,Savera Zaman,acting as a scribe for [employer], PA.,have documented all relevant documentation on the behalf of Jarold Motto, PA,as directed by  Jarold Motto, PA while in the presence of Jarold Motto, [place].   I, Jarold Motto, [place], have reviewed all documentation for this visit. The documentation on 10/24/21 for the exam, diagnosis, procedures, and orders are all accurate and complete.  Jarold Motto, PA-C

## 2021-10-24 NOTE — Patient Instructions (Signed)
It was great to see you!  Let's get blood work and check urine studies today  I will be in touch with your results  Typically we do not get too excited about lymphnodes unless they've been around for 4-6 weeks, but after another week or so, please reach out to me if you are still having issues and we will go ahead and pursue ultrasound imaging  Take care,  Jarold Motto PA-C

## 2021-10-25 LAB — CBC WITH DIFFERENTIAL/PLATELET
Basophils Absolute: 0.1 10*3/uL (ref 0.0–0.1)
Basophils Relative: 1.3 % (ref 0.0–3.0)
Eosinophils Absolute: 0.1 10*3/uL (ref 0.0–0.7)
Eosinophils Relative: 1.7 % (ref 0.0–5.0)
HCT: 37.1 % (ref 36.0–46.0)
Hemoglobin: 12.6 g/dL (ref 12.0–15.0)
Lymphocytes Relative: 21.9 % (ref 12.0–46.0)
Lymphs Abs: 1.9 10*3/uL (ref 0.7–4.0)
MCHC: 34 g/dL (ref 30.0–36.0)
MCV: 90.4 fl (ref 78.0–100.0)
Monocytes Absolute: 0.6 10*3/uL (ref 0.1–1.0)
Monocytes Relative: 7 % (ref 3.0–12.0)
Neutro Abs: 6 10*3/uL (ref 1.4–7.7)
Neutrophils Relative %: 68.1 % (ref 43.0–77.0)
Platelets: 344 10*3/uL (ref 150.0–400.0)
RBC: 4.1 Mil/uL (ref 3.87–5.11)
RDW: 13 % (ref 11.5–15.5)
WBC: 8.8 10*3/uL (ref 4.0–10.5)

## 2021-10-25 LAB — COMPREHENSIVE METABOLIC PANEL
ALT: 11 U/L (ref 0–35)
AST: 17 U/L (ref 0–37)
Albumin: 4.4 g/dL (ref 3.5–5.2)
Alkaline Phosphatase: 47 U/L (ref 39–117)
BUN: 15 mg/dL (ref 6–23)
CO2: 28 mEq/L (ref 19–32)
Calcium: 10 mg/dL (ref 8.4–10.5)
Chloride: 103 mEq/L (ref 96–112)
Creatinine, Ser: 1.07 mg/dL (ref 0.40–1.20)
GFR: 72.03 mL/min (ref >60.00)
Glucose, Bld: 87 mg/dL (ref 70–99)
Potassium: 4.5 mEq/L (ref 3.5–5.1)
Sodium: 140 mEq/L (ref 135–145)
Total Bilirubin: 0.5 mg/dL (ref 0.2–1.2)
Total Protein: 7.6 g/dL (ref 6.0–8.3)

## 2021-10-25 LAB — URINALYSIS, ROUTINE W REFLEX MICROSCOPIC
Bilirubin Urine: NEGATIVE
Hgb urine dipstick: NEGATIVE
Ketones, ur: NEGATIVE
Leukocytes,Ua: NEGATIVE
Nitrite: NEGATIVE
RBC / HPF: NONE SEEN (ref 0–?)
Specific Gravity, Urine: 1.02 (ref 1.000–1.030)
Total Protein, Urine: NEGATIVE
Urine Glucose: NEGATIVE
Urobilinogen, UA: 0.2 (ref 0.0–1.0)
pH: 6 (ref 5.0–8.0)

## 2021-10-25 LAB — TSH: TSH: 0.8 u[IU]/mL (ref 0.35–5.50)

## 2021-12-27 ENCOUNTER — Encounter: Payer: Self-pay | Admitting: Physician Assistant

## 2021-12-27 ENCOUNTER — Other Ambulatory Visit: Payer: Self-pay | Admitting: Physician Assistant

## 2021-12-27 MED ORDER — PROPRANOLOL HCL 20 MG PO TABS: 20.0000 mg | ORAL_TABLET | Freq: Three times a day (TID) | ORAL | 1 refills | Status: AC | PRN

## 2022-01-26 ENCOUNTER — Encounter: Payer: Self-pay | Admitting: Obstetrics and Gynecology

## 2022-01-26 DIAGNOSIS — Z8619 Personal history of other infectious and parasitic diseases: Secondary | ICD-10-CM

## 2022-01-26 MED ORDER — VALACYCLOVIR HCL 1 G PO TABS: ORAL_TABLET | ORAL | 1 refills | Status: AC

## 2022-02-13 ENCOUNTER — Ambulatory Visit
Admission: RE | Admit: 2022-02-13 | Discharge: 2022-02-13 | Disposition: A | Discharge status: Home / Self Care | Payer: Self-pay | Source: Ambulatory Visit | Attending: Emergency Medicine | Admitting: Emergency Medicine

## 2022-02-13 VITALS — BP 114/77 | HR 91 | Temp 98.6°F | Resp 16

## 2022-02-13 DIAGNOSIS — N390 Urinary tract infection, site not specified: Secondary | ICD-10-CM | POA: Insufficient documentation

## 2022-02-13 DIAGNOSIS — Z87442 Personal history of urinary calculi: Secondary | ICD-10-CM | POA: Insufficient documentation

## 2022-02-13 LAB — POCT URINALYSIS DIP (MANUAL ENTRY)
Bilirubin, UA: NEGATIVE
Glucose, UA: NEGATIVE mg/dL
Ketones, POC UA: NEGATIVE mg/dL
Nitrite, UA: NEGATIVE
Protein Ur, POC: NEGATIVE mg/dL
Spec Grav, UA: <=1.005 — AB (ref 1.010–1.025)
Urobilinogen, UA: 0.2 E.U./dL
pH, UA: 7 (ref 5.0–8.0)

## 2022-02-13 MED ORDER — TAMSULOSIN HCL 0.4 MG PO CAPS: 0.4000 mg | ORAL_CAPSULE | Freq: Every day | ORAL | 0 refills | Status: AC

## 2022-02-13 MED ORDER — IBUPROFEN 600 MG PO TABS: 600.0000 mg | ORAL_TABLET | Freq: Four times a day (QID) | ORAL | 0 refills | Status: AC | PRN

## 2022-02-13 MED ORDER — HYDROCODONE-ACETAMINOPHEN 5-325 MG PO TABS: 1.0000 | ORAL_TABLET | Freq: Four times a day (QID) | ORAL | 0 refills | Status: AC | PRN

## 2022-02-13 MED ORDER — CEFTRIAXONE SODIUM 1 G IJ SOLR
1.0000 g | Freq: Once | INTRAMUSCULAR | Status: AC
  Administered 2022-02-13: 1 g via INTRAMUSCULAR

## 2022-02-13 MED ORDER — CEPHALEXIN 500 MG PO CAPS: 500.0000 mg | ORAL_CAPSULE | Freq: Four times a day (QID) | ORAL | 0 refills | Status: AC

## 2022-02-13 NOTE — Discharge Instructions (Addendum)
Your urinalysis is consistent with a urinary tract infection.  I have sent it off for culture to make sure we have in the right antibiotic.  I have given you a gram of Rocephin to kick things off.  Finish the Keflex, even if you feel better.  Take 600 mg of ibuprofen with a Tylenol containing product 3-4 times a day.  Either 1000 mg of plain Tylenol for mild to moderate pain or 1-2 Norco for severe pain.  Do not take Tylenol and Norco at the same time as they both have Tylenol in them and too much Tylenol can hurt your liver.  Do not exceed 4000 mg of Tylenol from all sources in 1 day Flomax will help you pass the stone.  Follow-up with [facility].  Go to the ER for the signs and symptoms we discussed

## 2022-02-13 NOTE — ED Provider Notes (Signed)
HPI  SUBJECTIVE:  Jacqueline Giles is a 26 y.o. female who presents with dysuria, bilateral back pain, worse on the left.  Back pain radiates down her left flank with urination.  She reports nausea, chills.  No other abdominal pain, pelvic pain, other urinary complaints.  No back swelling.  She is able to completely empty her bladder now, although she was having difficulty doing so earlier today.  No vaginal odor, bleeding, discharge, vomiting, fevers.  She has taken ibuprofen 400 mg with improvement in her symptoms, last dose was within 6 hours of evaluation.  No aggravating factors.  No recent antibiotics.  She has a past medical history of UTI, infected, nonobstructing nephrolithiasis, no history of pyelonephritis.  She also has a history of elevated creatinine from the nephrolithiasis.  No history of chronic kidney disease.  States this feels similar to previous episodes of nephrolithiasis.  LMP: She has an IUD.  Denies possibility being pregnant.  PCP: [place].  Urology: None.    Past Medical History:  Diagnosis Date   Depression    History of chlamydia     Past Surgical History:  Procedure Laterality Date   INTRAUTERINE DEVICE (IUD) INSERTION  09/03/2017   Mirena    LAPAROSCOPIC APPENDECTOMY N/A 07/08/2019   Procedure: APPENDECTOMY LAPAROSCOPIC;  Surgeon: Coralie Keens, MD;  Location: [facility];  Service: General;  Laterality: N/A;   TONSILLECTOMY  04/2017    Family History  Problem Relation Age of Onset   Deep vein thrombosis Father 52   Breast cancer Maternal Grandmother     Social History   Tobacco Use   Smoking status: Never   Smokeless tobacco: Never  Vaping Use   Vaping Use: Never used  Substance Use Topics   Alcohol use: Yes    Comment: Social    Drug use: No     Current Facility-Administered Medications:    cefTRIAXone (ROCEPHIN) injection 1 g, 1 g, Intramuscular, Once, Melynda Ripple, MD  Current Outpatient Medications:    cephALEXin  (KEFLEX) 500 MG capsule, Take 1 capsule (500 mg total) by mouth 4 (four) times daily for 10 days., Disp: 40 capsule, Rfl: 0   HYDROcodone-acetaminophen (NORCO/VICODIN) 5-325 MG tablet, Take 1-2 tablets by mouth every 6 (six) hours as needed for moderate pain., Disp: 12 tablet, Rfl: 0   ibuprofen (ADVIL) 600 MG tablet, Take 1 tablet (600 mg total) by mouth every 6 (six) hours as needed., Disp: 30 tablet, Rfl: 0   levonorgestrel (MIRENA) 20 MCG/24HR IUD, 1 each by Intrauterine route once., Disp: , Rfl:    Multiple Vitamin (MULTIVITAMIN PO), Take by mouth., Disp: , Rfl:    propranolol (INDERAL) 20 MG tablet, Take 1 tablet (20 mg total) by mouth 3 (three) times daily as needed (anxiety)., Disp: 60 tablet, Rfl: 1   tamsulosin (FLOMAX) 0.4 MG CAPS capsule, Take 1 capsule (0.4 mg total) by mouth at bedtime for 7 days., Disp: 7 capsule, Rfl: 0   valACYclovir (VALTREX) 1000 MG tablet, Take 2 tablets po prn cold sore, repeat x one 12 hours later., Disp: 30 tablet, Rfl: 1  No Known Allergies   ROS  As noted in HPI.   Physical Exam  BP 114/77 (BP Location: Left Arm)   Pulse 91   Temp 98.6 F (37 C) (Oral)   Resp 16   SpO2 98%   Constitutional: Well developed, well nourished, no acute distress Eyes:  EOMI, conjunctiva normal bilaterally HENT: Normocephalic, atraumatic,mucus membranes moist Respiratory: Normal inspiratory effort Cardiovascular: Normal  rate GI: nondistended soft.  Positive suprapubic tenderness, bilateral flank tenderness, worse on the left.  No guarding, rebound. Back: No CVAT bilaterally. skin: No rash, skin intact Musculoskeletal: no deformities Neurologic: Alert & oriented x 3, no focal neuro deficits Psychiatric: Speech and behavior appropriate   ED Course   Medications  cefTRIAXone (ROCEPHIN) injection 1 g (has no administration in time range)    Orders Placed This Encounter  Procedures   Urine Culture    Standing Status:   Standing    Number of Occurrences:    1    Order Specific Question:   Indication    Answer:   Dysuria   POCT urinalysis dipstick    Standing Status:   Standing    Number of Occurrences:   1    Results for orders placed or performed during the hospital encounter of 02/13/22 (from the past 24 hour(s))  POCT urinalysis dipstick     Status: Abnormal   Collection Time: 02/13/22  5:50 PM  Result Value Ref Range   Color, UA yellow yellow   Clarity, UA cloudy (A) clear   Glucose, UA negative negative mg/dL   Bilirubin, UA negative negative   Ketones, POC UA negative negative mg/dL   Spec Grav, UA <=1.660 (A) 1.010 - 1.025   Blood, UA small (A) negative   pH, UA 7.0 5.0 - 8.0   Protein Ur, POC negative negative mg/dL   Urobilinogen, UA 0.2 0.2 or 1.0 E.U./dL   Nitrite, UA Negative Negative   Leukocytes, UA Moderate (2+) (A) Negative   No results found.  ED Clinical Impression  1. Complicated UTI (urinary tract infection)   2. History of nephrolithiasis      ED Assessment/Plan     Patient with an acute illness with systemic symptoms of chills.  Concern for complicated UTI, possible early pyelonephritis.  COVID creatinine clearance from labs done 08/18/2021 with 95 mL/min.  We discussed getting a KUB, but patient has a history of frequent kidney stones, and declined, stating that she feels as if she can completely empty her bladder.  She has moderate leukocytes, small blood concerning for UTI.  Will send this off for culture to confirm diagnosis, antibiotic choice.  Will give 1 g of Rocephin for complicated UTI, sent home with Keflex for 10 days in addition to Flomax, Tylenol/ibuprofen containing products or 4 times a day.  Tylenol for mild to moderate pain, Norco for severe pain.  Patient to follow[facility]up with [facility]AP.  ER return precautions given.  [facility] narcotic database reviewed.  No opiate prescriptions in the past 2 years.  Discussed labs, MDM, treatment plan, and plan for follow-up with patient.  Discussed sn/sx that should prompt return to the ED. patient agrees with plan.   Meds ordered this encounter  Medications   cefTRIAXone (ROCEPHIN) injection 1 g   cephALEXin (KEFLEX) 500 MG capsule    Sig: Take 1 capsule (500 mg total) by mouth 4 (four) times daily for 10 days.    Dispense:  40 capsule    Refill:  0   tamsulosin (FLOMAX) 0.4 MG CAPS capsule    Sig: Take 1 capsule (0.4 mg total) by mouth at bedtime for 7 days.    Dispense:  7 capsule    Refill:  0   HYDROcodone-acetaminophen (NORCO/VICODIN) 5-325 MG tablet    Sig: Take 1-2 tablets by mouth every 6 (six) hours as needed for moderate pain.    Dispense:  12 tablet  Refill:  0   ibuprofen (ADVIL) 600 MG tablet    Sig: Take 1 tablet (600 mg total) by mouth every 6 (six) hours as needed.    Dispense:  30 tablet    Refill:  0      *This clinic note was created using [employer]. Therefore, there may be occasional mistakes despite careful proofreading.  ?    Melynda Ripple, MD 02/16/22 1556

## 2022-02-13 NOTE — ED Notes (Signed)
Rocephin administered; monitored; tolerated well.

## 2022-02-13 NOTE — ED Triage Notes (Signed)
Patient presents to UC for dysuria and fever since last night. States she has a hx of kidney stones. Taking ibuprofen.

## 2022-02-15 LAB — URINE CULTURE: Culture: >=100000 — AB
